# Patient Record
Sex: Male | Born: 1990 | Race: Black or African American | Hispanic: No | Marital: Single | Smoking: Former smoker
Health system: Southern US, Community
[De-identification: ages and names within clinical notes are randomized; demographics above are authoritative.]

## PROBLEM LIST (undated history)

## (undated) DIAGNOSIS — I10 Essential (primary) hypertension: Secondary | ICD-10-CM

## (undated) DIAGNOSIS — K219 Gastro-esophageal reflux disease without esophagitis: Secondary | ICD-10-CM

## (undated) HISTORY — DX: Essential (primary) hypertension: I10

## (undated) HISTORY — PX: OTHER SURGICAL HISTORY: SHX169

## (undated) HISTORY — DX: Gastro-esophageal reflux disease without esophagitis: K21.9

---

## 2018-11-21 ENCOUNTER — Other Ambulatory Visit: Payer: Self-pay

## 2018-11-21 ENCOUNTER — Emergency Department (HOSPITAL_COMMUNITY)
Admission: EM | Admit: 2018-11-21 | Discharge: 2018-11-22 | Disposition: A | Discharge status: Home / Self Care | Payer: Self-pay | Attending: Emergency Medicine | Admitting: Emergency Medicine

## 2018-11-21 DIAGNOSIS — R0789 Other chest pain: Secondary | ICD-10-CM | POA: Insufficient documentation

## 2018-11-21 DIAGNOSIS — I471 Supraventricular tachycardia: Secondary | ICD-10-CM | POA: Insufficient documentation

## 2018-11-21 LAB — COMPREHENSIVE METABOLIC PANEL
ALT: 17 U/L (ref 0–44)
AST: 21 U/L (ref 15–41)
Albumin: 4.3 g/dL (ref 3.5–5.0)
Alkaline Phosphatase: 55 U/L (ref 38–126)
Anion gap: 11 (ref 5–15)
BUN: 9 mg/dL (ref 6–20)
CO2: 28 mmol/L (ref 22–32)
CREATININE: 0.77 mg/dL (ref 0.61–1.24)
Calcium: 9.5 mg/dL (ref 8.9–10.3)
Chloride: 102 mmol/L (ref 98–111)
GFR calc Af Amer: >60 mL/min (ref >60)
GFR calc non Af Amer: >60 mL/min (ref >60)
Glucose, Bld: 103 mg/dL — ABNORMAL HIGH (ref 70–99)
Potassium: 4.7 mmol/L (ref 3.5–5.1)
Sodium: 141 mmol/L (ref 135–145)
Total Bilirubin: 0.6 mg/dL (ref 0.3–1.2)
Total Protein: 7.9 g/dL (ref 6.5–8.1)

## 2018-11-21 LAB — CBC
HCT: 49.8 % (ref 39.0–52.0)
Hemoglobin: 15.3 g/dL (ref 13.0–17.0)
MCH: 26.5 pg (ref 26.0–34.0)
MCHC: 30.7 g/dL (ref 30.0–36.0)
MCV: 86.2 fL (ref 80.0–100.0)
Platelets: 342 10*3/uL (ref 150–400)
RBC: 5.78 MIL/uL (ref 4.22–5.81)
RDW: 13 % (ref 11.5–15.5)
WBC: 10 10*3/uL (ref 4.0–10.5)
nRBC: 0 % (ref 0.0–0.2)

## 2018-11-21 LAB — URINALYSIS, ROUTINE W REFLEX MICROSCOPIC
BACTERIA UA: NONE SEEN
Bilirubin Urine: NEGATIVE
GLUCOSE, UA: NEGATIVE mg/dL
Ketones, ur: NEGATIVE mg/dL
Leukocytes, UA: NEGATIVE
Nitrite: NEGATIVE
Protein, ur: NEGATIVE mg/dL
Specific Gravity, Urine: 1.008 (ref 1.005–1.030)
pH: 6 (ref 5.0–8.0)

## 2018-11-21 LAB — LIPASE, BLOOD: Lipase: 42 U/L (ref 11–51)

## 2018-11-21 LAB — I-STAT TROPONIN, ED: Troponin i, poc: 0 ng/mL (ref 0.00–0.08)

## 2018-11-21 MED ORDER — SODIUM CHLORIDE 0.9% FLUSH: 3.0000 mL | Freq: Once | INTRAVENOUS | Status: DC

## 2018-11-21 NOTE — ED Triage Notes (Signed)
Pt here with chest pain and pressure on and off for the last year.  Had a cath done a year ago when this had all started which showed nothing.  Pt states pain goes from chest into his abdomen and sometimes gets nauseated and throws up. A&Ox4

## 2018-11-22 LAB — TSH: TSH: 2.862 u[IU]/mL (ref 0.350–4.500)

## 2018-11-22 LAB — T4, FREE: Free T4: 0.86 ng/dL (ref 0.82–1.77)

## 2018-11-22 NOTE — ED Notes (Signed)
Pt's HR was noted to be 134 bpm. He stated that he felt like pounding in his chest when his HR became elevated.

## 2018-11-22 NOTE — ED Notes (Signed)
Pt reports that he intermittently experiences a "pounding" sensation in his chest or a fast heart rate when he moves from one position to another. Specifically when he moves from a crouching position and then quickly moves to a standing position. He also feels weak and SOB during these episodes. He denies feeling light headed or dizzy during these episode. No LOC.

## 2018-11-22 NOTE — ED Provider Notes (Signed)
[facility] EMERGENCY DEPARTMENT Provider Note   CSN: [id number] Arrival date & time: 11/21/18  2022     History   Chief Complaint Chief Complaint  Patient presents with  . Chest Pain  . Palpitations    HPI Timothy Barry is a 28 y.o. male.  Presents emergency department chief complaint of racing heart.  Patient states that he has had several episodes of racing in his heart which he has had going on for almost a month.  He states that it will come on at any time.  It would last for several seconds.  He feels short of breath or dizzy.  He denies chest pain.  He has noticed some globus sensation with swallowing.  He denies palpitations.  Patient is new from [place].  Translation services are utilized.  He had a catheterization done in [place] that was -1-year ago.  He denies nausea, vomiting, epigastric abdominal pain, shortness of breath, hemoptysis, cough, fevers, chills.  HPI  No past medical history on file.  There are no active problems to display for this patient.         Home Medications    Prior to Admission medications   Not on File    Family History No family history on file.  Social History Social History   Tobacco Use  . Smoking status: Not on file  Substance Use Topics  . Alcohol use: Not on file  . Drug use: Not on file     Allergies   Patient has no known allergies.   Review of Systems Review of Systems  Ten systems reviewed and are negative for acute change, except as noted in the HPI.   Physical Exam Updated Vital Signs BP 132/85   Pulse 78   Temp 98.4 F (36.9 C) (Oral)   Resp (!) 22   SpO2 100%   Physical Exam Physical Exam  Nursing note and vitals reviewed. Constitutional: He appears well-developed and well-nourished. No distress.  HENT:  Head: Normocephalic and atraumatic.  Eyes: Conjunctivae normal are normal. No scleral icterus.  Neck: Normal range of motion. Neck supple.  Cardiovascular: Normal rate,  regular rhythm and normal heart sounds.   Pulmonary/Chest: Effort normal and breath sounds normal. No respiratory distress.  Abdominal: Soft. There is no tenderness.  Musculoskeletal: He exhibits no edema.  Neurological: He is alert.  Skin: Skin is warm and dry. He is not diaphoretic.  Psychiatric: His behavior is normal.     ED Treatments / Results  Labs (all labs ordered are listed, but only abnormal results are displayed) Labs Reviewed  COMPREHENSIVE METABOLIC PANEL - Abnormal; Notable for the following components:      Result Value   Glucose, Bld 103 (*)    All other components within normal limits  URINALYSIS, ROUTINE W REFLEX MICROSCOPIC - Abnormal; Notable for the following components:   Color, Urine STRAW (*)    Hgb urine dipstick SMALL (*)    All other components within normal limits  LIPASE, BLOOD  CBC  TSH  T4, FREE  T3, FREE  I-STAT TROPONIN, ED    EKG EKG Interpretation  Date/Time:  Friday November 21 2018 23:33:25 EST Ventricular Rate:  79 PR Interval:    QRS Duration: 90 QT Interval:  362 QTC Calculation: 415 R Axis:   65 Text Interpretation:  Sinus rhythm Probable left atrial enlargement ST elevation suggests acute pericarditis No old tracing to compare Confirmed by Drema Pry [phone]) on 11/21/2018 11:38:08 PM   Radiology  No results found.  Procedures Procedures (including critical care time)  Medications Ordered in ED Medications - No data to display   Initial Impression / Assessment and Plan / ED Course  I have reviewed the triage vital signs and the nursing notes.  Pertinent labs & imaging results that were available during my care of the patient were reviewed by me and considered in my medical decision making (see chart for details).    Patient here with episodes of racing heart.  We were able to catch an episode of sinus tachycardia up to 130 on the monitor which lasted for approximately 10 seconds and resolved.  Patient was able to  call out and say that he was having the symptoms at that time.  Although the patient's EKG does show diffuse ST segment elevation he is not complaining of chest pain and I do not think that this is indicative of pericarditis or acute ischemia.  Patient's thyroid labs are negative for any abnormality.  He is otherwise stable without syncope shortness of breath.  Patient is advised to follow with cardiology for paroxysmal sinus tachycardia.  He appears appropriate for discharge at this time  Final Clinical Impressions(s) / ED Diagnoses   Final diagnoses:  Paroxysmal sinus tachycardia [facility])    ED Discharge Orders    None       Arthor Captain, PA-C 11/22/18 7619    Nira Conn, MD 11/22/18 [phone]

## 2018-11-22 NOTE — Discharge Instructions (Signed)
Get help right away if you: Have pain in your chest, upper arms, jaw, or neck. Become weak or dizzy. Feel faint. Have palpitations that do not go away. 

## 2018-11-22 NOTE — ED Notes (Signed)
Patient Alert and oriented to baseline. Stable and ambulatory to baseline. Patient verbalized understanding of the discharge instructions.  Patient belongings were taken by the patient.   

## 2018-11-24 LAB — T3, FREE: T3, Free: 3.5 pg/mL (ref 2.0–4.4)

## 2018-11-27 DIAGNOSIS — R002 Palpitations: Secondary | ICD-10-CM

## 2018-11-27 DIAGNOSIS — R9431 Abnormal electrocardiogram [ECG] [EKG]: Secondary | ICD-10-CM | POA: Insufficient documentation

## 2018-11-27 HISTORY — DX: Palpitations: R00.2

## 2018-11-27 HISTORY — DX: Abnormal electrocardiogram (ECG) (EKG): R94.31

## 2018-11-27 NOTE — Progress Notes (Deleted)
Cardiology Office Note:    Date:  11/27/2018   ID:  Timothy Barry, DOB [birth year], MRN [id number]  PCP:  Patient, No Pcp Per  Cardiologist:  Norman Herrlich, MD   Referring MD: Nira Conn,*  ASSESSMENT:    No diagnosis found. PLAN:    In order of problems listed above:  1. ***  Next appointment   Medication Adjustments/Labs and Tests Ordered: Current medicines are reviewed at length with the patient today.  Concerns regarding medicines are outlined above.  No orders of the defined types were placed in this encounter.  No orders of the defined types were placed in this encounter.    No chief complaint on file. ***  History of Present Illness:    Timothy Barry is a 28 y.o. male who is being seen today for the evaluation of an abnormal EKG done at ED visit 11/21/18 for [palpitation at the request of [name], [name],*.  I independently reviewed the EKG and I think it is most consistent with normal variant early repolarization often seen in young males.   No past medical history on file.  *** The histories are not reviewed yet. Please review them in the "History" navigator section and refresh this SmartLink.  Current Medications: No outpatient medications have been marked as taking for the 11/28/18 encounter (Appointment) with Baldo Daub, MD.     Allergies:   Patient has no known allergies.   Social History   Socioeconomic History  . Marital status: Single    Spouse name: Not on file  . Number of children: Not on file  . Years of education: Not on file  . Highest education level: Not on file  Occupational History  . Not on file  Social Needs  . Financial resource strain: Not on file  . Food insecurity:    Worry: Not on file    Inability: Not on file  . Transportation needs:    Medical: Not on file    Non-medical: Not on file  Tobacco Use  . Smoking status: Not on file  Substance and Sexual Activity  . Alcohol use: Not on file  .  Drug use: Not on file  . Sexual activity: Not on file  Lifestyle  . Physical activity:    Days per week: Not on file    Minutes per session: Not on file  . Stress: Not on file  Relationships  . Social connections:    Talks on phone: Not on file    Gets together: Not on file    Attends religious service: Not on file    Active member of club or organization: Not on file    Attends meetings of clubs or organizations: Not on file    Relationship status: Not on file  Other Topics Concern  . Not on file  Social History Narrative  . Not on file     Family History: The patient's ***family history is not on file.  ROS:   ROS Please see the history of present illness.    *** All other systems reviewed and are negative.  EKGs/Labs/Other Studies Reviewed:    The following studies were reviewed today: ***  EKG:  EKG is *** ordered today.  The ekg ordered today demonstrates ***  Recent Labs: 11/21/2018: ALT 17; BUN 9; Creatinine, Ser 0.77; Hemoglobin 15.3; Platelets 342; Potassium 4.7; Sodium 141 11/22/2018: TSH 2.862  Recent Lipid Panel No results found for: CHOL, TRIG, HDL, CHOLHDL, VLDL, LDLCALC, LDLDIRECT  Physical Exam:  VS:  There were no vitals taken for this visit.    Wt Readings from Last 3 Encounters:  No data found for Wt     GEN: *** Well nourished, well developed in no acute distress HEENT: Normal NECK: No JVD; No carotid bruits LYMPHATICS: No lymphadenopathy CARDIAC: ***RRR, no murmurs, rubs, gallops RESPIRATORY:  Clear to auscultation without rales, wheezing or rhonchi  ABDOMEN: Soft, non-tender, non-distended MUSCULOSKELETAL:  No edema; No deformity  SKIN: Warm and dry NEUROLOGIC:  Alert and oriented x 3 PSYCHIATRIC:  Normal affect     Signed, Norman Herrlich, MD  11/27/2018 10:22 AM    [facility]

## 2018-11-28 ENCOUNTER — Ambulatory Visit: Payer: Self-pay | Admitting: Cardiology

## 2019-11-20 ENCOUNTER — Emergency Department (HOSPITAL_COMMUNITY): Payer: 59

## 2019-11-20 ENCOUNTER — Encounter (HOSPITAL_COMMUNITY): Payer: Self-pay | Admitting: Emergency Medicine

## 2019-11-20 ENCOUNTER — Emergency Department (HOSPITAL_COMMUNITY)
Admission: EM | Admit: 2019-11-20 | Discharge: 2019-11-21 | Disposition: A | Discharge status: Home / Self Care | Payer: 59 | Attending: Emergency Medicine | Admitting: Emergency Medicine

## 2019-11-20 ENCOUNTER — Other Ambulatory Visit: Payer: Self-pay

## 2019-11-20 DIAGNOSIS — R1013 Epigastric pain: Secondary | ICD-10-CM | POA: Insufficient documentation

## 2019-11-20 DIAGNOSIS — R000 Tachycardia, unspecified: Secondary | ICD-10-CM | POA: Insufficient documentation

## 2019-11-20 DIAGNOSIS — M546 Pain in thoracic spine: Secondary | ICD-10-CM | POA: Insufficient documentation

## 2019-11-20 LAB — COMPREHENSIVE METABOLIC PANEL
ALT: 20 U/L (ref 0–44)
AST: 30 U/L (ref 15–41)
Albumin: 4.4 g/dL (ref 3.5–5.0)
Alkaline Phosphatase: 67 U/L (ref 38–126)
Anion gap: 14 (ref 5–15)
BUN: 9 mg/dL (ref 6–20)
CO2: 23 mmol/L (ref 22–32)
Calcium: 9.6 mg/dL (ref 8.9–10.3)
Chloride: 100 mmol/L (ref 98–111)
Creatinine, Ser: 0.75 mg/dL (ref 0.61–1.24)
GFR calc Af Amer: >60 mL/min (ref >60)
GFR calc non Af Amer: >60 mL/min (ref >60)
Glucose, Bld: 123 mg/dL — ABNORMAL HIGH (ref 70–99)
Potassium: 3.2 mmol/L — ABNORMAL LOW (ref 3.5–5.1)
Sodium: 137 mmol/L (ref 135–145)
Total Bilirubin: 0.7 mg/dL (ref 0.3–1.2)
Total Protein: 8.7 g/dL — ABNORMAL HIGH (ref 6.5–8.1)

## 2019-11-20 LAB — CBC WITH DIFFERENTIAL/PLATELET
Abs Immature Granulocytes: 0.02 10*3/uL (ref 0.00–0.07)
Basophils Absolute: 0 10*3/uL (ref 0.0–0.1)
Basophils Relative: 0 %
Eosinophils Absolute: 0.8 10*3/uL — ABNORMAL HIGH (ref 0.0–0.5)
Eosinophils Relative: 7 %
HCT: 48.8 % (ref 39.0–52.0)
Hemoglobin: 15.8 g/dL (ref 13.0–17.0)
Immature Granulocytes: 0 %
Lymphocytes Relative: 45 %
Lymphs Abs: 5.1 10*3/uL — ABNORMAL HIGH (ref 0.7–4.0)
MCH: 27.2 pg (ref 26.0–34.0)
MCHC: 32.4 g/dL (ref 30.0–36.0)
MCV: 84.1 fL (ref 80.0–100.0)
Monocytes Absolute: 1 10*3/uL (ref 0.1–1.0)
Monocytes Relative: 9 %
Neutro Abs: 4.6 10*3/uL (ref 1.7–7.7)
Neutrophils Relative %: 39 %
Platelets: 488 10*3/uL — ABNORMAL HIGH (ref 150–400)
RBC: 5.8 MIL/uL (ref 4.22–5.81)
RDW: 13.1 % (ref 11.5–15.5)
WBC: 11.6 10*3/uL — ABNORMAL HIGH (ref 4.0–10.5)
nRBC: 0 % (ref 0.0–0.2)

## 2019-11-20 LAB — URINALYSIS, ROUTINE W REFLEX MICROSCOPIC
Bilirubin Urine: NEGATIVE
Glucose, UA: NEGATIVE mg/dL
Hgb urine dipstick: NEGATIVE
Ketones, ur: NEGATIVE mg/dL
Leukocytes,Ua: NEGATIVE
Nitrite: NEGATIVE
Protein, ur: NEGATIVE mg/dL
Specific Gravity, Urine: 1.003 — ABNORMAL LOW (ref 1.005–1.030)
pH: 7 (ref 5.0–8.0)

## 2019-11-20 LAB — TROPONIN I (HIGH SENSITIVITY): Troponin I (High Sensitivity): 3 ng/L (ref <18)

## 2019-11-20 MED ORDER — SODIUM CHLORIDE 0.9 % IV BOLUS
2000.0000 mL | Freq: Once | INTRAVENOUS | Status: AC
  Administered 2019-11-20: 22:00:00 2000 mL via INTRAVENOUS

## 2019-11-20 MED ORDER — LORAZEPAM 2 MG/ML IJ SOLN
1.0000 mg | Freq: Once | INTRAMUSCULAR | Status: AC
  Administered 2019-11-20: 23:00:00 1 mg via INTRAVENOUS
  Filled 2019-11-20: qty 1

## 2019-11-20 NOTE — ED Triage Notes (Signed)
After taking pt to the waiting room to pt started having palpitations and his HR jump up to the 161's, EKG done and provider notified.

## 2019-11-20 NOTE — ED Notes (Signed)
Dewayne Hatch daughter [phone] call with update

## 2019-11-20 NOTE — ED Provider Notes (Signed)
Back pain on arrrival Found tachycardic to 160's  120's - 130's now Having mid-thoracic back pain and epigastric pain. No chest pain Labs pending, including thyroid No fever, sxs of infection - no cough, vomiting, SOB CTA pending EKG sinus tachycardia Getting fluids, improved heart rate  Consider metoprolol if he remains tachy on discharge  Plan: anticipate discharge home Review CTA, re-eval heart rate after fluids and Ativan  12:50 - patient comfortable. HR 89. CTA pending.  CT abd and CTA are negative for acute findings. Patient updated. His heart rate remains below 100 on recheck. Will refer to cardiology. Patient acknowledges understanding of plan to follow up in the outpatient setting.     Elpidio Anis, PA-C 11/21/19 0618    Ward, Layla Maw, DO 11/21/19 [phone]

## 2019-11-20 NOTE — ED Triage Notes (Signed)
Pt brought to ED by EMS for mid back pain since yesterday 6pm denies any injury no urinary symptoms.

## 2019-11-20 NOTE — ED Provider Notes (Signed)
[facility] EMERGENCY DEPARTMENT Provider Note   CSN: [id number] Arrival date & time: 11/20/19  2101     History Chief Complaint  Patient presents with  . Back Pain  . Palpitations    Timothy Barry is a 29 y.o. male.  Timothy Barry is a 29 y.o. male history of palpitations, who presents to the ED for evaluation of back pain and palpitations.  Patient initially checked in reporting midthoracic back pain that started yesterday, he describes pain as sharp and intermittent with some associated epigastric pain.  While here waiting in the department he began experiencing heart racing sensation was found to have a heart rate in the 160s.  He denies associated chest pain or shortness of breath.  He denies lightheadedness or syncope.  He states that he has had similar issues with fast heart rate before and at 1 point was on a medication to control his heart rate, and followed with a heart doctor not here in [place].  He states pain in the thoracic back is worse with movement.  It is not radiating into the chest, he denies tearing no ripping sensation.  He has not had any nausea or vomiting, no diarrhea, has not recently eaten anything out of the ordinary.  He denies fevers or chills, no cough, no body aches, no sore throat.  No dysuria.  He has not taken anything to treat his symptoms prior to arrival.  No other aggravating or alleviating factors.  The history is provided by the patient. The history is limited by a language barrier. A language interpreter was used.       History reviewed. No pertinent past medical history.  Patient Active Problem List   Diagnosis Date Noted  . Abnormal EKG 11/27/2018  . Palpitation 11/27/2018    History reviewed. No pertinent surgical history.     History reviewed. No pertinent family history.  Social History   Tobacco Use  . Smoking status: Never Smoker  . Smokeless tobacco: Never Used  Substance Use Topics  . Alcohol use:  Never  . Drug use: Never    Home Medications Prior to Admission medications   Not on File    Allergies    Patient has no known allergies.  Review of Systems   Review of Systems  Constitutional: Negative for chills and fever.  HENT: Negative.   Respiratory: Negative for cough and shortness of breath.   Cardiovascular: Positive for palpitations. Negative for chest pain and leg swelling.  Gastrointestinal: Positive for abdominal pain. Negative for diarrhea, nausea and vomiting.  Genitourinary: Negative for dysuria and frequency.  Musculoskeletal: Positive for back pain.  Skin: Negative for color change and rash.  Neurological: Negative for dizziness, syncope, weakness, light-headedness, numbness and headaches.    Physical Exam Updated Vital Signs BP (!) 151/88 (BP Location: Left Arm)   Pulse 84   Temp 99.1 F (37.3 C) (Oral)   Resp 16   Ht 5\' 10"  (1.778 m)   Wt 136.1 kg   SpO2 98%   BMI 43.05 kg/m   Physical Exam Vitals and nursing note reviewed.  Constitutional:      General: He is not in acute distress.    Appearance: He is well-developed. He is not diaphoretic.  HENT:     Head: Normocephalic and atraumatic.  Eyes:     General:        Right eye: No discharge.        Left eye: No discharge.  Pupils: Pupils are equal, round, and reactive to light.  Cardiovascular:     Rate and Rhythm: Regular rhythm. Tachycardia present.     Heart sounds: Normal heart sounds.     Comments: Tachycardia with heart rate ranging between 140-160, regular rhythm Pulmonary:     Effort: Pulmonary effort is normal. No respiratory distress.     Breath sounds: Normal breath sounds. No wheezing or rales.  Abdominal:     General: Bowel sounds are normal. There is no distension.     Palpations: Abdomen is soft. There is no mass.     Tenderness: There is abdominal tenderness. There is no guarding.     Comments: Abdomen soft, nondistended, bowel sounds present throughout, mild epigastric  tenderness without guarding or rebound, all other quadrants nontender  Musculoskeletal:        General: No deformity.     Cervical back: Neck supple.  Skin:    General: Skin is warm and dry.     Capillary Refill: Capillary refill takes less than 2 seconds.  Neurological:     Mental Status: He is alert.     Coordination: Coordination normal.     Comments: Speech is clear, able to follow commands 5/5 strength in bilateral upper and lower extremities Moves extremities without ataxia, coordination intact  Psychiatric:        Mood and Affect: Mood normal.        Behavior: Behavior normal.     ED Results / Procedures / Treatments   Labs (all labs ordered are listed, but only abnormal results are displayed) Labs Reviewed  URINALYSIS, ROUTINE W REFLEX MICROSCOPIC - Abnormal; Notable for the following components:      Result Value   Color, Urine STRAW (*)    Specific Gravity, Urine 1.003 (*)    All other components within normal limits  CBC WITH DIFFERENTIAL/PLATELET - Abnormal; Notable for the following components:   WBC 11.6 (*)    Platelets 488 (*)    Lymphs Abs 5.1 (*)    Eosinophils Absolute 0.8 (*)    All other components within normal limits  COMPREHENSIVE METABOLIC PANEL  TROPONIN I (HIGH SENSITIVITY)    EKG EKG Interpretation  Date/Time:  Friday November 20 2019 21:18:03 EST Ventricular Rate:  162 PR Interval:  100 QRS Duration: 84 QT Interval:  254 QTC Calculation: 416 R Axis:   134 Text Interpretation: Sinus tachycardia with short PR Right axis deviation Abnormal ECG rate significantly faster than Nov 22 2018 Confirmed by Pricilla Loveless [phone]) on 11/20/2019 9:36:11 PM   Radiology DG Chest Port 1 View  Result Date: 11/20/2019 CLINICAL DATA:  Chest pain EXAM: PORTABLE CHEST 1 VIEW COMPARISON:  None. FINDINGS: The heart size and mediastinal contours are within normal limits. Both lungs are clear. The visualized skeletal structures are unremarkable. IMPRESSION: No  active disease. Electronically Signed   By: Jasmine Pang M.D.   On: 11/20/2019 21:52    Procedures Procedures (including critical care time)  Medications Ordered in ED Medications  sodium chloride 0.9 % bolus 2,000 mL (2,000 mLs Intravenous New Bag/Given 11/20/19 2226)  LORazepam (ATIVAN) injection 1 mg (1 mg Intravenous Given 11/20/19 2236)  iohexol (OMNIPAQUE) 350 MG/ML injection 100 mL (100 mLs Intravenous Contrast Given 11/21/19 0006)    ED Course  I have reviewed the triage vital signs and the nursing notes.  Pertinent labs & imaging results that were available during my care of the patient were reviewed by me and considered in my medical decision  making (see chart for details).  Clinical Course as of Nov 20 33  Fri Nov 20, 2019  2951 29 year old male presented initially with thoracic back pain, but then reported palpitations and was found to have a heart rate in the 160s.  EKG is consistent with sinus tachycardia.  Patient states intermittent palpitations since yesterday associated with thoracic back pain and epigastric abdominal pain.  No associated chest pains.  Will give IV fluids and Ativan, will get lab evaluation including troponins, abdominal labs, TSH, will check CTA of the chest and CT abdomen pelvis.   [KF]  2200 Mild leukocytosis, potassium of 3.2 but no other significant electrolyte derangements, UA without signs of infection, additional lab work pending   [KF]  2200 Chest x-ray is clear  DG Chest Port 1 View [KF]  2349 At shift change care signed out to PA [facility] pending additional lab evaluation and CTAs of the chest abdomen and pelvis.  Patient is being treated with IV fluids and Ativan for his sinus tachycardia.  If evaluation is   [KF]    Clinical Course User Index [KF] Legrand Rams   MDM Rules/Calculators/A&P                      Patient initially presented with thoracic back pain, later developed palpitations and was found to have a heart  rate in the 160s.  Throughout evaluation patient remained in sinus tachycardia, no evidence of SVT.  He complained of some midthoracic back pain as well as some epigastric pain no pain radiating into the chest, no associated shortness of breath.  Despite tachycardia patient is well-appearing and vitals are otherwise stable.  He does appear somewhat anxious.  He was given 2 L IV fluid and Ativan.  Lab work as well as CT imaging of the chest abdomen and pelvis ordered.  Care signed out to PA [facility] pending lab and imaging evaluation.  If work-up is reassuring and heart rate returned to normal patient can be discharged home.  If heart rate remains elevated would consider beta-blocker dose.  Final Clinical Impression(s) / ED Diagnoses Final diagnoses:  Sinus tachycardia  Epigastric pain  Acute midline thoracic back pain    Rx / DC Orders ED Discharge Orders    None       Legrand Rams 11/21/19 2333    Pricilla Loveless, MD 11/23/19 [phone]

## 2019-11-21 LAB — TROPONIN I (HIGH SENSITIVITY): Troponin I (High Sensitivity): 10 ng/L (ref <18)

## 2019-11-21 LAB — LIPASE, BLOOD: Lipase: 28 U/L (ref 11–51)

## 2019-11-21 LAB — TSH: TSH: 0.603 u[IU]/mL (ref 0.350–4.500)

## 2019-11-21 MED ORDER — IOHEXOL 350 MG/ML SOLN
100.0000 mL | Freq: Once | INTRAVENOUS | Status: AC | PRN
  Administered 2019-11-21: 100 mL via INTRAVENOUS

## 2019-11-21 NOTE — Discharge Instructions (Addendum)
Your lab and x-ray tests are all reassuring. Your thyroid test did result tonight and is normal also.   Please call [facility] at the number provided to schedule an outpatient appointment for further evaluation of high heart rate.

## 2019-11-21 NOTE — ED Notes (Signed)
Ativan 1mg  wasted in jug  Witnessed by yasemia rn

## 2019-11-24 ENCOUNTER — Telehealth: Payer: Self-pay | Admitting: Radiology

## 2019-11-24 ENCOUNTER — Encounter: Payer: Self-pay | Admitting: Cardiology

## 2019-11-24 ENCOUNTER — Other Ambulatory Visit: Payer: Self-pay

## 2019-11-24 ENCOUNTER — Ambulatory Visit (INDEPENDENT_AMBULATORY_CARE_PROVIDER_SITE_OTHER): Payer: 59 | Admitting: Cardiology

## 2019-11-24 VITALS — BP 134/78 | HR 84 | Temp 97.3°F | Ht 70.0 in | Wt 286.0 lb

## 2019-11-24 DIAGNOSIS — R002 Palpitations: Secondary | ICD-10-CM

## 2019-11-24 DIAGNOSIS — R072 Precordial pain: Secondary | ICD-10-CM | POA: Diagnosis not present

## 2019-11-24 NOTE — Telephone Encounter (Signed)
Enrolled patient for a 30 day [employer] Event monitor to be mailed to patients home.  

## 2019-11-24 NOTE — Patient Instructions (Signed)
Medication Instructions:  NO CHANGE *If you need a refill on your cardiac medications before your next appointment, please call your pharmacy*  Lab Work: If you have labs (blood work) drawn today and your tests are completely normal, you will receive your results only by: Marland Kitchen MyChart Message (if you have MyChart) OR . A paper copy in the mail If you have any lab test that is abnormal or we need to change your treatment, we will call you to review the results.  Testing/Procedures: Your physician has requested that you have an echocardiogram. Echocardiography is a painless test that uses sound waves to create images of your heart. It provides your doctor with information about the size and shape of your heart and how well your heart's chambers and valves are working. This procedure takes approximately one hour. There are no restrictions for this procedure.[address]  Your physician has recommended that you wear a 30 DAY event monitor. Event monitors are medical devices that record the heart's electrical activity. Doctors most often [place] these monitors to diagnose arrhythmias. Arrhythmias are problems with the speed or rhythm of the heartbeat. The monitor is a small, portable device. You can wear one while you do your normal daily activities. This is usually used to diagnose what is causing palpitations/syncope (passing out).MAILED TO YOUR HOME      Follow-Up: At [facility], you and your health needs are our priority.  As part of our continuing mission to provide you with exceptional heart care, we have created designated Provider Care Teams.  These Care Teams include your primary Cardiologist (physician) and Advanced Practice Providers (APPs -  Physician Assistants and Nurse Practitioners) who all work together to provide you with the care you need, when you need it.  Your next appointment:   6 week(s)  The format for your next appointment:   In Person  Provider:   You may see  one of the following Advanced Practice Providers on your designated Care Team:    Corine Shelter, PA-C  [place], [place]  Edd Fabian, [place]

## 2019-11-24 NOTE — Progress Notes (Signed)
Referring-Kelsey Ala Dach, PA-C Reason for referral-palpitations  HPI: 29 year old male for evaluation of palpitations at request of Jodi Geralds, PA-C.  Recently seen in the emergency room with complaints of back pain and palpitations.  Initial heart rate 160 by report.  CTA January 2021 showed no pulmonary embolus or dissection.  Troponins normal.  TSH normal, potassium 3.2, hemoglobin 15.8.  Patient was hydrated and referred to cardiology for further evaluation.  Patient is from [place].  He apparently had testing on his heart 2 years ago for the above and etiology was not found.  He states he has intermittent palpitations described as heart racing.  Last typically 10 minutes and resolves.  More common after eating.  He denies dyspnea on exertion, orthopnea or pedal edema.  He has had continuous chest pain for 1 year by his report.  He  No current outpatient medications on file.   No current facility-administered medications for this visit.    No Known Allergies   Past Medical History:  Diagnosis Date  . Abnormal EKG 11/27/2018  . Palpitation 11/27/2018    Past Surgical History:  Procedure Laterality Date  . No prior surgery      Social History   Socioeconomic History  . Marital status: Single    Spouse name: Not on file  . Number of children: Not on file  . Years of education: Not on file  . Highest education level: Not on file  Occupational History  . Not on file  Tobacco Use  . Smoking status: Former Games developer  . Smokeless tobacco: Never Used  Substance and Sexual Activity  . Alcohol use: Never  . Drug use: Never  . Sexual activity: Not on file  Other Topics Concern  . Not on file  Social History Narrative  . Not on file   Social Determinants of Health   Financial Resource Strain:   . Difficulty of Paying Living Expenses: Not on file  Food Insecurity:   . Worried About Programme researcher, broadcasting/film/video in the Last Year: Not on file  . Ran Out of Food in the Last Year: Not on  file  Transportation Needs:   . Lack of Transportation (Medical): Not on file  . Lack of Transportation (Non-Medical): Not on file  Physical Activity:   . Days of Exercise per Week: Not on file  . Minutes of Exercise per Session: Not on file  Stress:   . Feeling of Stress : Not on file  Social Connections:   . Frequency of Communication with Friends and Family: Not on file  . Frequency of Social Gatherings with Friends and Family: Not on file  . Attends Religious Services: Not on file  . Active Member of Clubs or Organizations: Not on file  . Attends Banker Meetings: Not on file  . Marital Status: Not on file  Intimate Partner Violence:   . Fear of Current or Ex-Partner: Not on file  . Emotionally Abused: Not on file  . Physically Abused: Not on file  . Sexually Abused: Not on file    Family History  Problem Relation Age of Onset  . Cancer Father     ROS: no fevers or chills, productive cough, hemoptysis, dysphasia, odynophagia, melena, hematochezia, dysuria, hematuria, rash, seizure activity, orthopnea, PND, pedal edema, claudication. Remaining systems are negative.  Physical Exam:   Blood pressure 134/78, pulse 84, temperature (!) 97.3 F (36.3 C), height 5\' 10"  (1.778 m), weight 286 lb (129.7 kg), SpO2 99 %.  General:  Well developed/well nourished in NAD Skin warm/dry Patient not depressed No peripheral clubbing Back-normal HEENT-normal/normal eyelids Neck supple/normal carotid upstroke bilaterally; no bruits; no JVD; no thyromegaly chest - CTA/ normal expansion CV - RRR/normal S1 and S2; no murmurs, rubs or gallops;  PMI nondisplaced Abdomen -NT/ND, no HSM, no mass, + bowel sounds, no bruit 2+ femoral pulses, no bruits Ext-no edema, chords, 2+ DP Neuro-grossly nonfocal  ECG -November 20, 2019-sinus tachycardia, right axis deviation.  Follow-up electrocardiogram November 22, 2019 showed sinus rhythm with no ST changes.  Personally reviewed  A/P  1  palpitations-etiology unclear.  We will arrange an event monitor to further assess.  Recent TSH normal.  2 chest pain-symptoms atypical.  They have been continuous for 1 year.  Recent CTA showed no pulmonary embolus.  We will arrange echocardiogram to assess LV function.  Kirk Ruths, MD

## 2019-11-25 ENCOUNTER — Encounter (HOSPITAL_COMMUNITY): Payer: Self-pay | Admitting: Emergency Medicine

## 2019-11-25 ENCOUNTER — Emergency Department (HOSPITAL_COMMUNITY)
Admission: EM | Admit: 2019-11-25 | Discharge: 2019-11-25 | Disposition: A | Discharge status: Home / Self Care | Payer: 59 | Attending: Emergency Medicine | Admitting: Emergency Medicine

## 2019-11-25 ENCOUNTER — Emergency Department (HOSPITAL_COMMUNITY): Payer: 59

## 2019-11-25 ENCOUNTER — Other Ambulatory Visit: Payer: Self-pay

## 2019-11-25 DIAGNOSIS — K219 Gastro-esophageal reflux disease without esophagitis: Secondary | ICD-10-CM | POA: Diagnosis not present

## 2019-11-25 DIAGNOSIS — Z87891 Personal history of nicotine dependence: Secondary | ICD-10-CM | POA: Insufficient documentation

## 2019-11-25 DIAGNOSIS — R1013 Epigastric pain: Secondary | ICD-10-CM | POA: Diagnosis present

## 2019-11-25 DIAGNOSIS — Z7982 Long term (current) use of aspirin: Secondary | ICD-10-CM | POA: Diagnosis not present

## 2019-11-25 DIAGNOSIS — R0789 Other chest pain: Secondary | ICD-10-CM | POA: Insufficient documentation

## 2019-11-25 DIAGNOSIS — R1011 Right upper quadrant pain: Secondary | ICD-10-CM

## 2019-11-25 LAB — URINALYSIS, ROUTINE W REFLEX MICROSCOPIC
Bacteria, UA: NONE SEEN
Bilirubin Urine: NEGATIVE
Glucose, UA: NEGATIVE mg/dL
Ketones, ur: NEGATIVE mg/dL
Leukocytes,Ua: NEGATIVE
Nitrite: NEGATIVE
Protein, ur: NEGATIVE mg/dL
Specific Gravity, Urine: 1.004 — ABNORMAL LOW (ref 1.005–1.030)
pH: 7 (ref 5.0–8.0)

## 2019-11-25 LAB — COMPREHENSIVE METABOLIC PANEL
ALT: 19 U/L (ref 0–44)
AST: 22 U/L (ref 15–41)
Albumin: 4.2 g/dL (ref 3.5–5.0)
Alkaline Phosphatase: 62 U/L (ref 38–126)
Anion gap: 8 (ref 5–15)
BUN: 9 mg/dL (ref 6–20)
CO2: 28 mmol/L (ref 22–32)
Calcium: 9.6 mg/dL (ref 8.9–10.3)
Chloride: 102 mmol/L (ref 98–111)
Creatinine, Ser: 0.65 mg/dL (ref 0.61–1.24)
GFR calc Af Amer: >60 mL/min (ref >60)
GFR calc non Af Amer: >60 mL/min (ref >60)
Glucose, Bld: 106 mg/dL — ABNORMAL HIGH (ref 70–99)
Potassium: 3.8 mmol/L (ref 3.5–5.1)
Sodium: 138 mmol/L (ref 135–145)
Total Bilirubin: 0.6 mg/dL (ref 0.3–1.2)
Total Protein: 8.4 g/dL — ABNORMAL HIGH (ref 6.5–8.1)

## 2019-11-25 LAB — CBC WITH DIFFERENTIAL/PLATELET
Abs Immature Granulocytes: 0.02 10*3/uL (ref 0.00–0.07)
Basophils Absolute: 0 10*3/uL (ref 0.0–0.1)
Basophils Relative: 1 %
Eosinophils Absolute: 0.5 10*3/uL (ref 0.0–0.5)
Eosinophils Relative: 9 %
HCT: 47.9 % (ref 39.0–52.0)
Hemoglobin: 15.5 g/dL (ref 13.0–17.0)
Immature Granulocytes: 0 %
Lymphocytes Relative: 39 %
Lymphs Abs: 2.5 10*3/uL (ref 0.7–4.0)
MCH: 27.4 pg (ref 26.0–34.0)
MCHC: 32.4 g/dL (ref 30.0–36.0)
MCV: 84.8 fL (ref 80.0–100.0)
Monocytes Absolute: 0.6 10*3/uL (ref 0.1–1.0)
Monocytes Relative: 10 %
Neutro Abs: 2.7 10*3/uL (ref 1.7–7.7)
Neutrophils Relative %: 41 %
Platelets: 382 10*3/uL (ref 150–400)
RBC: 5.65 MIL/uL (ref 4.22–5.81)
RDW: 13 % (ref 11.5–15.5)
WBC: 6.4 10*3/uL (ref 4.0–10.5)
nRBC: 0 % (ref 0.0–0.2)

## 2019-11-25 LAB — TROPONIN I (HIGH SENSITIVITY): Troponin I (High Sensitivity): 3 ng/L (ref <18)

## 2019-11-25 LAB — LIPASE, BLOOD: Lipase: 28 U/L (ref 11–51)

## 2019-11-25 MED ORDER — LIDOCAINE VISCOUS HCL 2 % MT SOLN
15.0000 mL | Freq: Once | OROMUCOSAL | Status: AC
  Administered 2019-11-25: 15 mL via ORAL
  Filled 2019-11-25: qty 15

## 2019-11-25 MED ORDER — PANTOPRAZOLE SODIUM 20 MG PO TBEC: 20.0000 mg | DELAYED_RELEASE_TABLET | Freq: Every day | ORAL | 0 refills | Status: DC

## 2019-11-25 MED ORDER — ALUM & MAG HYDROXIDE-SIMETH 200-200-20 MG/5ML PO SUSP
30.0000 mL | Freq: Once | ORAL | Status: AC
  Administered 2019-11-25: 30 mL via ORAL
  Filled 2019-11-25: qty 30

## 2019-11-25 NOTE — Discharge Instructions (Signed)
You have been diagnosed today with epigastric pain, GERD.  At this time there does not appear to be the presence of an emergent medical condition, however there is always the potential for conditions to change. Please read and follow the below instructions.  Please return to the Emergency Department immediately for any new or worsening symptoms. Please be sure to follow up with your Primary Care Provider within one week regarding your visit today; please call their office to schedule an appointment even if you are feeling better for a follow-up visit.  Please call the [facility] community health and wellness phone number on your discharge paperwork to establish a primary care provider if you do not already have one. Please take the medication Protonix as prescribed to help with your symptoms.  Please drink plenty of water and get plenty of rest.  Get help right away if you: Have pain in your arms, neck, jaw, teeth, or back. Feel sweaty, dizzy, or light-headed. Have chest pain or shortness of breath. Vomit and your vomit looks like blood or coffee grounds. Faint. Have stool that is bloody or black. Cannot swallow, drink, or eat. Your pain does not go away as soon as your doctor says it should. You cannot stop vomiting. Your pain is only in areas of your belly, such as the right side or the left lower part of the belly. You have bloody or black poop, or poop that looks like tar. You have very bad pain, cramping, or bloating in your belly. You have signs of not having enough fluid or water in your body (dehydration), such as: Dark pee, very little pee, or no pee. Cracked lips. Dry mouth. Sunken eyes. Sleepiness. Weakness. You have trouble breathing or chest pain. You have any new/concerning or worsening of symptoms  Please read the additional information packets attached to your discharge summary.  Do not take your medicine if  develop an itchy rash, swelling in your mouth or lips, or  difficulty breathing; call 911 and seek immediate emergency medical attention if this occurs.  Note: Portions of this text may have been transcribed using voice recognition software. Every effort was made to ensure accuracy; however, inadvertent computerized transcription errors may still be present. -------------- Below has been translated using Google translate.  Errors may be present.  Interpret with caution. ????? ??? ?????? ???????? ????? ????. ?? ???? ??????? ??????. ??? ????. -------------- ??? ?? ?????? ????? ???? ?????? ? ?????? ??????.  ?? ??? ????? ?? ???? ?? ???? ???? ???? ????? ? ???? ???? ?????? ??????? ?????? ??????. ???? ????? ?????? ????????? ?????.  1. ???? ?????? ??? ??? ??????? ??? ????? ??? ????? ????? ?? ?????. 2. ???? ?????? ?? ???????? ?? ???? ??????? ??????? ????? ?? ?? ???? ????? ???? ????? ?????? ????? ? ???? ??????? ??????? ?????? ???? ??? ??? ??? ???? ????? ?? ????? ??????. ???? ??????? ???? ???? ????? ???????? ?????? ?????? [place] ?? ????? ?????? ?????? ?? ?????? ???? ????? ????? ??? ?? ??? ???? ???? ??????. 3. ???? ????? ???? Protonix ??? ?? ????? ???????? ?? ???? ???????. ???? ??? ?????? ?? ????? ??????? ??? ??? ???? ?? ??????.  ???? ??? ???????? ????? ??? ???: ? ???? ??? ?? ?????? ?? ????? ?? ??? ?? ?????? ?? ????. ? ???? ??????? ?? ?????? ?? ??????. ? ???? ??? ?? ????? ?? ??? ?? ??????. ? ???? ????? ?????? ???? ?? ??????. ? ???? ????. ? ???? ???? ???? ?? ????. ? ?? ?????? ????? ?? ????? ?? ?????. 1. ???? ?? ???? ????? ?? ???? ????? ???.  2. ?? ????? ?????? ?? ??????. 3. ???? ????? ?? ????? ???? ??? ? ??? ?????? ?????? ?? ????? ?????? ?????? ?? ?????. 4. ???? ????? ???? ?? ???? ? ?? ???? ???? ???????. 5. ???? ??? ???? ?? ???? ?? ?????? ?? ????. 6. ???? ?????? ??? ??? ??? ???? ?? ???? ?? ??????? ?? ????? ?? ???? (??????) ? ???: o ??? ???? ? ??? ???? ??? ? ?? ???? ???. o ???? ??????. o ???? ????. ?? ???? ?????. ? ??????. o ???. 7. ???? ????? ??  ?????? ?? ??? ?? ?????. 8. ???? ?? ????? ????? / ????? ?? ????? ???????  ???? ????? ??? ????????? ???????? ??????? ????? ?????? ????? ??.  ?? ?????? ????? ?? ???? ???? ??? ???? ???? ????? ?? ???? ?? ??? ?? ????? ?? ????? ?? ?????? ? ???? ???? 911 ????? ????? ???? ????? ????? ??? ??? ???.  ??????: ?? ??? ??? ????? ?? ??? ???? ???????? ?????? ?????? ??? ?????. ?? ??? ?? ??? ????? ????? ? ??? ??? ? ?? ?? ???? ????? ????? ???????? ??? ???????? ??????.

## 2019-11-25 NOTE — ED Triage Notes (Signed)
Pt c/o epigastric/central lower chest pains that is intermittent x 4 days. Reports is worse after eating or drinking. Has nausea but no vomiting.

## 2019-11-25 NOTE — ED Provider Notes (Signed)
[facility]-EMERGENCY DEPT Provider Note   CSN: [id number] Arrival date & time: 11/25/19  1645     History Chief Complaint  Patient presents with  . Abdominal Pain  . Chest Pain    Timothy Barry is a 29 y.o. male presents today for epigastric pain x4 days.  Patient reports a constant, waxing and waning burning sensation that radiates from epigastric area up into his lower chest.  Patient reports pain is worsened with eating and drinking and improved if he does not swallow anything for a while.  He reports mild nausea without vomiting as only associated symptom.  Denies fever/chills, fall/injury, headache, neck pain, shortness of breath, cough/hemoptysis, history of blood clot, numbness/tingling, weakness, diaphoresis, vomiting/diarrhea, extremity swelling/color change, recent surgeries or immobilizations, hormone use, history of cancer or additional concerns.  HPI     Past Medical History:  Diagnosis Date  . Abnormal EKG 11/27/2018  . Palpitation 11/27/2018    Patient Active Problem List   Diagnosis Date Noted  . Abnormal EKG 11/27/2018  . Palpitation 11/27/2018    Past Surgical History:  Procedure Laterality Date  . No prior surgery         Family History  Problem Relation Age of Onset  . Cancer Father     Social History   Tobacco Use  . Smoking status: Former Games developer  . Smokeless tobacco: Never Used  Substance Use Topics  . Alcohol use: Never  . Drug use: Never    Home Medications Prior to Admission medications   Medication Sig Start Date End Date Taking? Authorizing Provider  aspirin EC 81 MG tablet Take 81 mg by mouth daily.   Yes [provider]  pantoprazole (PROTONIX) 20 MG tablet Take 1 tablet (20 mg total) by mouth daily. 11/25/19 12/25/19  Bill Salinas, PA-C    Allergies    Patient has no known allergies.  Review of Systems   Review of Systems Ten systems are reviewed and are negative for acute change except  as noted in the HPI  Physical Exam Updated Vital Signs BP 132/77   Pulse 70   Temp 98.3 F (36.8 C) (Oral)   Resp 19   SpO2 100%   Physical Exam Constitutional:      General: He is not in acute distress.    Appearance: Normal appearance. He is well-developed. He is not ill-appearing or diaphoretic.  HENT:     Head: Normocephalic and atraumatic.     Right Ear: External ear normal.     Left Ear: External ear normal.     Nose: Nose normal.  Eyes:     General: Vision grossly intact. Gaze aligned appropriately.     Pupils: Pupils are equal, round, and reactive to light.  Neck:     Trachea: Trachea and phonation normal. No tracheal deviation.  Cardiovascular:     Rate and Rhythm: Normal rate and regular rhythm.     Pulses:          Radial pulses are 2+ on the right side and 2+ on the left side.       Dorsalis pedis pulses are 2+ on the right side and 2+ on the left side.     Heart sounds: Normal heart sounds.  Pulmonary:     Effort: Pulmonary effort is normal. No respiratory distress.     Breath sounds: Normal breath sounds.  Chest:     Chest wall: No deformity.  Abdominal:     General: There is  no distension.     Palpations: Abdomen is soft.     Tenderness: There is abdominal tenderness (Mild) in the epigastric area. There is no guarding or rebound.  Musculoskeletal:        General: Normal range of motion.     Cervical back: Normal range of motion.     Right lower leg: No tenderness. No edema.     Left lower leg: No tenderness. No edema.  Skin:    General: Skin is warm and dry.  Neurological:     Mental Status: He is alert.     GCS: GCS eye subscore is 4. GCS verbal subscore is 5. GCS motor subscore is 6.     Comments: Speech is clear and goal oriented, follows commands Major Cranial nerves without deficit, no facial droop Moves extremities without ataxia, coordination intact  Psychiatric:        Behavior: Behavior normal.     ED Results / Procedures / Treatments    Labs (all labs ordered are listed, but only abnormal results are displayed) Labs Reviewed  URINALYSIS, ROUTINE W REFLEX MICROSCOPIC - Abnormal; Notable for the following components:      Result Value   Color, Urine STRAW (*)    Specific Gravity, Urine 1.004 (*)    Hgb urine dipstick SMALL (*)    All other components within normal limits  COMPREHENSIVE METABOLIC PANEL - Abnormal; Notable for the following components:   Glucose, Bld 106 (*)    Total Protein 8.4 (*)    All other components within normal limits  LIPASE, BLOOD  CBC WITH DIFFERENTIAL/PLATELET  TROPONIN I (HIGH SENSITIVITY)    EKG EKG Interpretation  Date/Time:  Wednesday November 25 2019 16:55:21 EST Ventricular Rate:  84 PR Interval:    QRS Duration: 86 QT Interval:  340 QTC Calculation: 402 R Axis:     Text Interpretation: Sinus rhythm ST elev, probable normal early repol pattern Confirmed by Davonna Belling [id number]) on 11/25/2019 8:35:22 PM   Radiology DG Chest 2 View  Result Date: 11/25/2019 CLINICAL DATA:  Chest pain. EXAM: CHEST - 2 VIEW COMPARISON:  November 20, 2019 FINDINGS: The heart size and mediastinal contours are within normal limits. Both lungs are clear. The visualized skeletal structures are unremarkable. IMPRESSION: No active cardiopulmonary disease. Electronically Signed   By: Virgina Norfolk M.D.   On: 11/25/2019 17:26   US Abdomen Limited RUQ  Result Date: 11/25/2019 CLINICAL DATA:  Right upper quadrant abdominal pain. EXAM: ULTRASOUND ABDOMEN LIMITED RIGHT UPPER QUADRANT COMPARISON:  11/21/2019 FINDINGS: Gallbladder: No gallstones or wall thickening visualized. No sonographic Murphy sign noted by sonographer. Common bile duct: Diameter: 3 mm Liver: No focal lesion identified. Within normal limits in parenchymal echogenicity. Portal vein is patent on color Doppler imaging with normal direction of blood flow towards the liver. Other: None. IMPRESSION: Normal study.  No findings to explain the  patient's abdominal pain. Electronically Signed   By: Constance Holster M.D.   On: 11/25/2019 19:27    Procedures Procedures (including critical care time)  Medications Ordered in ED Medications  alum & mag hydroxide-simeth (MAALOX/MYLANTA) 200-200-20 MG/5ML suspension 30 mL (30 mLs Oral Given 11/25/19 2015)    And  lidocaine (XYLOCAINE) 2 % viscous mouth solution 15 mL (15 mLs Oral Given 11/25/19 2015)    ED Course  I have reviewed the triage vital signs and the nursing notes.  Pertinent labs & imaging results that were available during my care of the patient were reviewed by me  and considered in my medical decision making (see chart for details).    MDM Rules/Calculators/A&P                     Chart review shows recent ED visit on 11/20/2019 for sinus tachycardia, epigastric pain, and midline lower back pain.  Work-up included:  Urinalysis nonacute CMP nonacute, mild elevated glucose and protein CBC with leukocytosis of 11.6 High-sensitivity troponin: 3 CXR: IMPRESSION: No active disease. Lipase within normal limits TSH within normal limits Delta high-sensitivity troponin: 10 CT angio chest PE study: IMPRESSION: No active disease.  CT KT:[id number]: No acute abnormality noted.  ============ Patient was then seen yesterday by cardiology, Dr. Jens Som on 11/25/2019  According to assessment and plan palpitations of unclear etiology and is arranging for patient to have an event monitor, chest pain thought to be atypical as continuous for 1 year per note, they are arranging an echocardiogram to assess left ventricular function. ================ Patient arrived today with epigastric pain worse with eating, mildly tender to palpation, question possible gallbladder etiology will obtain lab work and ultrasound.  High-sensitivity troponin within normal limits Lipase within normal limits CBC within normal limits CMP nonacute, mildly elevated glucose and protein Chest x-ray:    IMPRESSION:  No active cardiopulmonary disease.   RUQ [place]:  IMPRESSION:  Normal study. No findings to explain the patient's abdominal pain.   EKG: Sinus rhythm ST elev, probable normal early repol pattern Confirmed by Benjiman Core [phone]) on 11/25/2019 8:35:22 PM  Patient was given GI cocktail, reassessed resting comfortably no acute distress using his phone.  Suspect GERD as etiology of patient's symptoms today.  He had extensive work-up last week which was largely reassuring, he is low risk by Wells criteria and PERC negative and with negative PE study last week doubt pulmonary embolism as etiology of symptoms today.  With ongoing pain for 4 days and a troponin within normal limits there is no indication for delta troponin at this time, doubt ACS as etiology of his symptoms.  Additionally he had CT of the chest abdomen pelvis last week and history/presentation not consistent with aneurysm/dissection doubt this is etiology of his symptoms today.  On reexamination his abdomen is soft nontender and without peritoneal signs, doubt SBO, perforation, diverticulitis, appendicitis or other emergent etiology of his symptoms today, do not feel additional imaging or work-up is needed in the ER at this time.  Case was discussed with Dr. Rubin Payor who agrees with discharge and outpatient PCP follow-up, will prescribe Protonix for treatment of GERD.  Additionally patient will be following up with cardiology for future echo and event monitor, he had no complaints of palpitations and no tachycardia during visit today do not feel additional work-up is needed in the ER at this time.  At this time there does not appear to be any evidence of an acute emergency medical condition and the patient appears stable for discharge with appropriate outpatient follow up. Diagnosis was discussed with patient who verbalizes understanding of care plan and is agreeable to discharge. I have discussed return precautions with  patient who verbalizes understanding of return precautions. Patient encouraged to follow-up with their PCP.  All questions answered.    Note: Portions of this report may have been transcribed using voice recognition software. Every effort was made to ensure accuracy; however, inadvertent computerized transcription errors may still be present.  Phone interpreter used during this visit.  Final Clinical Impression(s) / ED Diagnoses Final diagnoses:  Abdominal pain, RUQ  Epigastric pain  Gastroesophageal reflux disease, unspecified whether esophagitis present    Rx / DC Orders ED Discharge Orders         Ordered    pantoprazole (PROTONIX) 20 MG tablet  Daily     11/25/19 2145           Elizabeth Palau 11/25/19 2156    Benjiman Core, MD 11/25/19 [phone]

## 2019-11-27 ENCOUNTER — Encounter (INDEPENDENT_AMBULATORY_CARE_PROVIDER_SITE_OTHER): Payer: 59

## 2019-11-27 DIAGNOSIS — R002 Palpitations: Secondary | ICD-10-CM

## 2019-11-30 ENCOUNTER — Other Ambulatory Visit: Payer: Self-pay | Admitting: *Deleted

## 2019-11-30 NOTE — Patient Outreach (Addendum)
[facility]) Care Management  11/30/2019  Timothy Barry [birth year] [id number]  Referral received 1/22 [facility]) Initial outreach 1/25 Specific Interpreter for Arabic: [employer] #[id number]([phone])  Telephone Assessment  Attempted an outreach however unsuccessful. Interpreter able to leave a HIPAA approved voice message requesting a call back if pt has assistance with language barrier.  Plan: Will send outreach letter and attempt another outreach call over the next week using the language interpreter.  Elliot Cousin, RN Care Management Coordinator [facility] Main Office [phone]

## 2019-12-02 ENCOUNTER — Other Ambulatory Visit: Payer: Self-pay

## 2019-12-02 ENCOUNTER — Ambulatory Visit (HOSPITAL_COMMUNITY): Payer: 59 | Attending: Cardiovascular Disease

## 2019-12-02 DIAGNOSIS — R002 Palpitations: Secondary | ICD-10-CM | POA: Diagnosis present

## 2019-12-03 ENCOUNTER — Other Ambulatory Visit: Payer: Self-pay | Admitting: *Deleted

## 2019-12-03 NOTE — Patient Outreach (Signed)
[facility]) Care Management  12/03/2019  Timothy Barry [birth year] [id number]    Pt discussed in Multidisciplinary case discussion.  Elliot Cousin, RN Care Management Coordinator [facility] Main Office [phone]

## 2019-12-07 ENCOUNTER — Other Ambulatory Visit: Payer: Self-pay | Admitting: *Deleted

## 2019-12-07 NOTE — Patient Outreach (Signed)
[facility]) Care Management  12/07/2019  Tavius Turgeon 12-25-1990 [id number]   Outreach #2 Specific Interpreters # [phone] Leila (Arabic)  HIPAA message left via voice message and requested any English speaking person that maybe currently assisting with the pt at this time.  Plan: Will follow up once again over the next week for the 3rd outreach.  Elliot Cousin, RN Care Management Coordinator [facility] Main Office [phone]

## 2019-12-09 ENCOUNTER — Other Ambulatory Visit: Payer: Self-pay

## 2019-12-09 ENCOUNTER — Ambulatory Visit (INDEPENDENT_AMBULATORY_CARE_PROVIDER_SITE_OTHER): Payer: 59 | Admitting: Medical

## 2019-12-09 ENCOUNTER — Encounter: Payer: Self-pay | Admitting: Medical

## 2019-12-09 VITALS — BP 120/80 | HR 78 | Temp 98.8°F | Ht 70.0 in | Wt 289.2 lb

## 2019-12-09 DIAGNOSIS — R079 Chest pain, unspecified: Secondary | ICD-10-CM | POA: Diagnosis not present

## 2019-12-09 DIAGNOSIS — R0789 Other chest pain: Secondary | ICD-10-CM

## 2019-12-09 DIAGNOSIS — K30 Functional dyspepsia: Secondary | ICD-10-CM

## 2019-12-09 DIAGNOSIS — R739 Hyperglycemia, unspecified: Secondary | ICD-10-CM | POA: Insufficient documentation

## 2019-12-09 DIAGNOSIS — K219 Gastro-esophageal reflux disease without esophagitis: Secondary | ICD-10-CM

## 2019-12-09 DIAGNOSIS — R002 Palpitations: Secondary | ICD-10-CM | POA: Diagnosis not present

## 2019-12-09 LAB — POCT GLYCOSYLATED HEMOGLOBIN (HGB A1C): Hemoglobin A1C: 5.7 % — AB (ref 4.0–5.6)

## 2019-12-09 MED ORDER — PANTOPRAZOLE SODIUM 40 MG PO TBEC: DELAYED_RELEASE_TABLET | ORAL | 0 refills | Status: DC

## 2019-12-09 MED ORDER — SUCRALFATE 1 GM/10ML PO SUSP: 1.0000 g | Freq: Three times a day (TID) | ORAL | 0 refills | Status: DC

## 2019-12-09 NOTE — Addendum Note (Signed)
Addended by: Victorio Palm on: 12/09/2019 03:30 PM   Modules accepted: Orders
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
## 2019-12-09 NOTE — Patient Instructions (Addendum)
Recommendations:  Increase Protonix to higher dose 40mg  daily, about 45 minutes before breakfast  Use Sucralfate about 30 minutes before meals to coat inside of stomach  Avoiding eating fast  Avoiding eating within 1 hour of bedtime  Eat smaller portions  Avoid spicy foods, acidic foods, peppers, hot sauce and lots of spices  You can use Tylenol over the counter, 500mg  up to every 6 hours for pain  Continue plan to turn in the heart monitor.  Monitor your pulse rate.   If your pulse is greater than 120 bpm at rest, then recheck sooner  Walk for exercise.   Do some stretching regularly, daily  If the heart test doesn't end up showing anythign abnormal, then you may end up needing to see gastroenterology   Work on losing weight    Gastroesophageal Reflux Disease, Adult   Gastroesophageal reflux disease (GERD) happens when acid from your stomach flows up into the esophagus. When acid comes in contact with the esophagus, the acid causes soreness (inflammation) in the esophagus. Over time, GERD may create small holes (ulcers) in the lining of the esophagus.  CAUSES   Increased body weight. This puts pressure on the stomach, making acid rise from the stomach into the esophagus.   Smoking. This increases acid production in the stomach.   Drinking alcohol. This causes decreased pressure in the lower esophageal sphincter (valve or ring of muscle between the esophagus and stomach), allowing acid from the stomach into the esophagus.   Late evening meals and a full stomach. This increases pressure and acid production in the stomach.   A malformed lower esophageal sphincter.  Sometimes, no cause is found.  SYMPTOMS   Burning pain in the lower part of the mid-chest behind the breastbone and in the mid-stomach area. This may occur twice a week or more often.   Trouble swallowing.   Sore throat.   Dry cough.   Asthma-like symptoms including chest tightness, shortness of  breath, or wheezing.   DIAGNOSIS  Your caregiver may be able to diagnose GERD based on your symptoms. In some cases, X-rays and other tests may be done to check for complications or to check the condition of your stomach and esophagus.  TREATMENT  You may use Protonix.   HOME CARE INSTRUCTIONS   Change the factors that you can control. Ask your caregiver for guidance concerning weight loss, quitting smoking, and alcohol consumption.   Avoid foods and drinks that make your symptoms worse, such as:   Caffeine or alcoholic drinks.   Chocolate.   Peppermint or mint flavorings.   Garlic and onions.   Spicy foods.   Citrus fruits, such as oranges, lemons, or limes.   Tomato-based foods such as sauce, chili, salsa, and pizza.   Fried and fatty foods.   Avoid lying down for the 3 hours prior to your bedtime or prior to taking a nap.   Eat small, frequent meals instead of large meals.   Wear loose-fitting clothing. Do not wear anything tight around your waist that causes pressure on your stomach.   Raise the head of your bed 6 to 8 inches with wood blocks to help you sleep. Extra pillows will not help.   Only take over-the-counter or prescription medicines for pain, discomfort, or fever as directed by your caregiver.   Do not take aspirin, ibuprofen, or other nonsteroidal anti-inflammatory drugs (NSAIDs).   SEEK IMMEDIATE MEDICAL CARE IF:   You have pain in your arms, neck, jaw,  teeth, or back.   Your pain increases or changes in intensity or duration.   You develop nausea, vomiting, or sweating (diaphoresis).   You develop shortness of breath, or you faint.   Your vomit is green, yellow, black, or looks like coffee grounds or blood.   Your stool is red, bloody, or black.  These symptoms could be signs of other problems, such as heart disease, gastric bleeding, or esophageal bleeding. MAKE SURE YOU:   Understand these instructions.   Will watch your condition.    Will get help right away if you are not doing well or get worse.  Document Released: 08/01/2005 Document Revised: 07/04/2011 Document Reviewed: 05/11/2011 [facility] Patient Information 2012 [place], [place].

## 2019-12-11 ENCOUNTER — Other Ambulatory Visit: Payer: Self-pay | Admitting: *Deleted

## 2019-12-11 NOTE — Patient Outreach (Signed)
[facility]) Care Management  12/11/2019  Glennis Montenegro July 07, 1991 [id number]    Outreach #3 RN used [employer] to attempt the 3rd outreach however unsuccessful. Interpreter was able to leave a HIPAA approved voice message requesting a call back. Based upon the unsuccessful call and no return call this case will be closed.   Plan: Will notify the provider and close this case.  Elliot Cousin, RN Care Management Coordinator [facility] Main Office [phone]

## 2019-12-14 ENCOUNTER — Telehealth: Payer: Self-pay

## 2019-12-14 NOTE — Telephone Encounter (Signed)
Received a fax from [employer] for the PA I submitted for the pts. Sucralfate and it was approved from 12/13/19-12/12/20.

## 2019-12-17 ENCOUNTER — Telehealth: Payer: Self-pay | Admitting: Medical

## 2019-12-17 NOTE — Telephone Encounter (Signed)
Message has been sent to patient on mychart.  

## 2019-12-17 NOTE — Telephone Encounter (Signed)
Pt called and states that the medicine carafate is not covered by his insurance please send something else to the [id number] -  ([place]), [place] - [address]

## 2019-12-17 NOTE — Telephone Encounter (Signed)
Ok. Have him continue the Protonix 40mg  higher dose daily, avoid spicy foods, hot sauce, peppers and citrus for now.  Have him call insurance to see if Carafate tablets are covered.  If not, there is no similar prescription medication similar to Carafate liquid or tablet.   Other option would be to add Pepto Bismol or Mylanta oral solution 1-2 times daily over the counter as alternative to Carafate.    F/u in a few weeks for recheck or physical

## 2019-12-21 ENCOUNTER — Other Ambulatory Visit: Payer: Self-pay

## 2019-12-21 ENCOUNTER — Ambulatory Visit (INDEPENDENT_AMBULATORY_CARE_PROVIDER_SITE_OTHER): Payer: 59 | Admitting: Medical

## 2019-12-21 ENCOUNTER — Encounter: Payer: Self-pay | Admitting: Medical

## 2019-12-21 VITALS — BP 120/82 | HR 68 | Temp 98.2°F | Ht 70.0 in | Wt 287.6 lb

## 2019-12-21 DIAGNOSIS — R002 Palpitations: Secondary | ICD-10-CM | POA: Diagnosis not present

## 2019-12-21 DIAGNOSIS — R143 Flatulence: Secondary | ICD-10-CM | POA: Diagnosis not present

## 2019-12-21 DIAGNOSIS — R079 Chest pain, unspecified: Secondary | ICD-10-CM | POA: Diagnosis not present

## 2019-12-21 DIAGNOSIS — R140 Abdominal distension (gaseous): Secondary | ICD-10-CM | POA: Insufficient documentation

## 2019-12-21 NOTE — Progress Notes (Signed)
Subjective:  Timothy Barry is a 29 y.o. male who presents for Chief Complaint  Patient presents with  . Back Pain     Here today with his male cousin  Who helps translate.  I saw him for the first time 12/09/2019.    Yesterday at work at rest felt chest pain, palpations, called EMS.   EMS came out and did rhythms stirp and wrote some notes for [place] to review.  Has been having similar episodes maybe a few times per week, random.  When he feels this episode of chest pain and palpations, feels overall weak, tired, unable to walk even 30 feet when he gets these brief episodes.   This episode yesterday lasted 10 minutes.   Sometimes its 5-10 minutes, not longer.   Feels very tired when this symptom occurs.   No dizziness, no syncope.   Snores sometimes.  cousin says not snoring regularly.  No witnessed apnea.    Denies excessive worry, anxiety, overly stressed.    Been having a lot of gas, stomach making a lot of noise.  Sometimes has loose stool.  No vomiting.    From last visit, for past 2 months having some palpitations, chest and upper back pains, gets ongoing chest pains.  Has been to the emergency dept for this a few times.   Has been put on heart monitor for 30 days and turns this in 12/27/19.  On acid reflux medication for same symptoms.  This has helped some.   Does eat sometimes big portions, close to bedtime.    No physical activity he thinks is related to his chest pain.  No pain with physical activity.   No other aggravating or relieving factors.    No other c/o.  The following portions of the patient's history were reviewed and updated as appropriate: allergies, current medications, past family history, past medical history, past social history, past surgical history and problem list.  ROS Otherwise as in subjective above  Objective: BP 120/82   Pulse 68   Temp 98.2 F (36.8 C)   Ht 5\' 10"  (1.778 m)   Wt 287 lb 9.6 oz (130.5 kg)   SpO2 99%   BMI 41.27 kg/m    BP Readings  from Last 3 Encounters:  12/21/19 120/82  12/09/19 120/80  11/25/19 132/77    General appearance: alert, no distress, well developed, well nourished, [place] male Oral cavity: MMM, no lesions Neck: supple, no lymphadenopathy, questionable thyromegaly, no masses, no JVD or bruit Heart: RRR, normal S1, S2, no murmurs Lungs: CTA bilaterally, no wheezes, rhonchi, or rales Chest wall nontender, normal I:E Abdomen: +bs, soft, mild left lower tendnerss, otherwise non tender, non distended, no masses, no hepatomegaly, no splenomegaly Pulses: 2+ radial pulses, 2+ pedal pulses, normal cap refill Ext: no edema   Assessment: Encounter Diagnoses  Name Primary?  . Palpitation Yes  . Chest pain, unspecified type   . Flatulence   . Bloating      Plan: We discussed his symptoms and concerns.  I reviewed the rhythm strip and EMS notes yesterday where rate was up to 110 bpm and Bp elevated at 160/92 but normal today.  I reviewed his 11/25/19 emergency dept visit and 11/20/19 and 11/22/19 ED visit.  Normal echocardiogram  12/02/19.   CXR normal 11/25/19.  Normal abdominal ultrasound 11/25/19.    Labs from 11/20/19 and 11/25/19 reviewed, with normal troponin's, most recent labs normal blood counts, normal CMET other than glucose elevated and protein slightly elevated.  Lipase normal.    Thyroid TSH normal.    He has event monitor to turn in later this month.    I will send Dr. Jens Som a message about his symptoms today in case he wants to see him sooner.  Anxiety could be playing a role.    He has family back home in [place] he is concerned about.    We discussed his gas and bloating. C/t Protonix, avoid gas producing foods, can use Pepto OTC as needed.  Counseled on diet and exercise recommendations  He will turn in the Holter monitor as planned.  [place] was seen today for back pain.  Diagnoses and all orders for this visit:  Palpitation  Chest pain, unspecified  type  Flatulence  Bloating    Follow up: with cardiology

## 2019-12-21 NOTE — Patient Instructions (Addendum)
Recommendations:  Continue Protonix 40mg  daily, about 45 minutes before breakfast  Since Sucralfate liquid was not covered by insurance, try over the counter Pepto Bismol once or twice daily.  This will make stool look dark  Avoiding eating fast/quickly  Avoiding eating within 1 hour of bedtime  Eat smaller portions  Avoid spicy foods, acidic foods, peppers, hot sauce and lots of spices, avoid deep fried foods, avoid onions, avoid beans that make you gassy   You can use Tylenol over the counter, 500mg  up to every 6 hours for pain  Continue plan to turn in the heart monitor.  Monitor your pulse rate.   If your pulse is greater than 120 bpm at rest, then recheck sooner  Walk for exercise.   Do some stretching regularly, daily  If the heart test doesn't end up showing anything abnormal, then you may end up needing to see gastroenterology  I will email Dr. Stanford Breed cardiology about your symptoms to see if he wants you to do anything different currently  If needed you can use over the counter Gas X   Try using this guide below  Exercise  I recommend walking, bicycling, hiking or other exercise 30-45 minutes at least 3 days per week.  Ideally gradually work up to exercise most days per week.   Breakfast You may eat 1 of the following  Smoothie with Almond milk, handful of kale or spinach, and 1-2 fruit servings of your choice such as berries or 1/2 banana  Whole grain slice of toast and thin layer of low sugar jam or small amount of honey  Whole grain slice of toast and avocado spread  1/2 cup of steel cut oats (oatmeal)   Mid-morning snack 1 fruit serving such as one of the following:  medium-sized apple  medium-sized orange,  Tangerine  1/2 banana   3/4 cup of fresh berries or frozen berries  A protein source such as one of the following:  8 almonds   small handful of walnuts or other nuts  Hummus and vegetable such as carrots   Lunch A protein  source such as 1 of the following: . 1 serving of beans such as black beans, green beans, or edamame (soy beans) . Veggie burger  . Non breaded fish such as salmon or tuna, either baked, grilled, or broiled Vegetable - Half of your plate should be a non-starchy vegetables!  So avoid white potatoes and corn.  Otherwise, eat a large portion of vegetables. . Avocado, cucumber, tomato, carrots, greens, lettuce, squash, okra, etc.  . Vegetables can include salad with olive oil/vinaigrette dressing Grains such as 1/2 cup of brown rice, quinoa, barley or other whole grain or 1 or 2 slices of whole grain bread   Mid-afternoon snack 1 fruit serving such as one of the following:  medium-sized apple  medium-sized orange,  Tangerine  1/2 banana   3/4 cup of fresh berries or frozen berries  A protein source such as one of the following:  8 almonds   small handful of walnuts or other nuts  Hummus and vegetable such as carrots   Dinner A protein source such as 1 of the following: . 1 serving of beans such as black beans, green beans, or edamame (soy beans) . Veggie burger  . Non breaded fish such as salmon or tuna, either baked, grilled, or broiled Vegetable - Half of your plate should be a non-starchy vegetables!  So avoid white potatoes and corn.  Otherwise, eat a  large portion of vegetables. . Avocado, cucumber, tomato, carrots, greens, lettuce, squash, okra, etc.  . Vegetables can include salad with olive oil/vinaigrette dressing Grains such as 1/2 cup of brown rice, quinoa, barley or other whole grain or 1 or 2 slices of whole grain bread   Beverages: Water Unsweet tea Home made juice with a juicer without sugar added other than small bit of honey or agave nectar Water with sugar free flavor such as M[place]   AVOID.... For the time being I want you to cut out the following items completely: . Soda, sweet tea, juice, beer or wine or alcohol . Sweets such as cake, candy, pies,  chips, cookies, chocolate

## 2019-12-23 ENCOUNTER — Encounter: Payer: Self-pay | Admitting: Medical

## 2019-12-31 ENCOUNTER — Telehealth (INDEPENDENT_AMBULATORY_CARE_PROVIDER_SITE_OTHER): Payer: 59 | Admitting: Cardiology

## 2019-12-31 ENCOUNTER — Encounter: Payer: Self-pay | Admitting: Cardiology

## 2019-12-31 VITALS — Ht 70.0 in

## 2019-12-31 DIAGNOSIS — R000 Tachycardia, unspecified: Secondary | ICD-10-CM | POA: Diagnosis not present

## 2019-12-31 DIAGNOSIS — K219 Gastro-esophageal reflux disease without esophagitis: Secondary | ICD-10-CM

## 2019-12-31 DIAGNOSIS — R002 Palpitations: Secondary | ICD-10-CM

## 2019-12-31 NOTE — Progress Notes (Signed)
Virtual Visit via Telephone Note   This visit type was conducted due to national recommendations for restrictions regarding the COVID-19 Pandemic (e.g. social distancing) in an effort to limit this patient's exposure and mitigate transmission in our community.  Due to his co-morbid illnesses, this patient is at least at moderate risk for complications without adequate follow up.  This format is felt to be most appropriate for this patient at this time.  The patient did not have access to video technology/had technical difficulties with video requiring transitioning to audio format only (telephone).  All issues noted in this document were discussed and addressed.  No physical exam could be performed with this format.  Please refer to the patient's chart for his  consent to telehealth for [facility].   Date:  12/31/2019   ID:  Timothy Barry, DOB 12/13/1990, MRN [id number]  Patient Location: Home Provider Location: Home  PCP:  Jac Canavan, PA-C  Cardiologist:  Dr Jens Som Electrophysiologist:  None   Evaluation Performed:  Follow-Up Visit  Chief Complaint:  none  History of Present Illness:    Timothy Barry is a 29 y.o. male who is from [place] who was seen in the ED 11/25/2019 for palpitations and back pain. His Initial heart rate was 160 by report, his EKG confirmed narrow complex tachycardia at 162.  This improved with hydration.  CTA showed no pulmonary embolus or dissection.  Troponins normal.  TSH normal, potassium 3.2, hemoglobin 15.8.  Patient was hydrated and referred to cardiology for further evaluation.  Dr Jens Som saw him in the office 11/24/2019 and ordered an echo and monitor.  The patient was contacted today to discuss the results.   Echo 12/02/2019 showed normal LVF with an EF of 60-65%, no LVH, mild LAE, normal RVF. His monitor showed NSR-80's, occasional PAC and PVC.  I explained these results to the patient.  He denied any new complaints.     The patient does not  have symptoms concerning for COVID-19 infection (fever, chills, cough, or new shortness of breath).    Past Medical History:  Diagnosis Date  . Abnormal EKG 11/27/2018  . Palpitation 11/27/2018   Past Surgical History:  Procedure Laterality Date  . No prior surgery       Current Meds  Medication Sig  . aspirin EC 81 MG tablet Take 81 mg by mouth daily.  . pantoprazole (PROTONIX) 40 MG tablet 1 tablet daily po 45 min prior to breakfast  . sucralfate (CARAFATE) 1 GM/10ML suspension Take 10 mLs (1 g total) by mouth 4 (four) times daily -  with meals and at bedtime.  . [DISCONTINUED] pantoprazole (PROTONIX) 20 MG tablet Take 20 mg by mouth daily.     Allergies:   Patient has no known allergies.   Social History   Tobacco Use  . Smoking status: Former Games developer  . Smokeless tobacco: Never Used  Substance Use Topics  . Alcohol use: Never  . Drug use: Never     Family Hx: The patient's family history includes Cancer in his father; Heart disease (age of onset: 81) in his maternal grandmother.  ROS:   Please see the history of present illness.    All other systems reviewed and are negative.   Prior CV studies:   The following studies were reviewed today: Echo 12/02/2019 Monitor 12/30/2019  Labs/Other Tests and Data Reviewed:    EKG:  An ECG dated 12/20/2019 was personally reviewed today and demonstrated:  NSR, HR 75  Recent Labs:  11/21/2019: TSH 0.603 11/25/2019: ALT 19; BUN 9; Creatinine, Ser 0.65; Hemoglobin 15.5; Platelets 382; Potassium 3.8; Sodium 138   Recent Lipid Panel No results found for: CHOL, TRIG, HDL, CHOLHDL, LDLCALC, LDLDIRECT  Wt Readings from Last 3 Encounters:  12/21/19 287 lb 9.6 oz (130.5 kg)  12/09/19 289 lb 3.2 oz (131.2 kg)  11/24/19 286 lb (129.7 kg)     Objective:    Vital Signs:  Ht 5\' 10"  (1.778 m)   BMI 41.27 kg/m    VITAL SIGNS:  reviewed  ASSESSMENT & PLAN:    Tachycardia- Seen in Ed with sinus tachycardia 160- ? Anxiety, ?  Dehydration, ?secondary to back pain. No arrhythmia on monitor, echo normal, f/u EKG normal.  Pt reassured.  COVID-19 Education: The signs and symptoms of COVID-19 were discussed with the patient and how to seek care for testing (follow up with PCP or arrange E-visit).  The importance of social distancing was discussed today.  Time:   Today, I have spent 10 minutes with the patient with telehealth technology discussing the above problems.     Medication Adjustments/Labs and Tests Ordered: Current medicines are reviewed at length with the patient today.  Concerns regarding medicines are outlined above.   Tests Ordered: No orders of the defined types were placed in this encounter.   Medication Changes: No orders of the defined types were placed in this encounter.   Follow Up:  Either In Person or Virtual prn  Signed, Kerin Ransom, PA-C  12/31/2019 10:10 AM    [place]

## 2019-12-31 NOTE — Patient Instructions (Signed)
Medication Instructions:  Your physician recommends that you continue on your current medications as directed. Please refer to the Current Medication list given to you today.  *If you need a refill on your cardiac medications before your next appointment, please call your pharmacy*   Follow-Up: At [facility], you and your health needs are our priority.  As part of our continuing mission to provide you with exceptional heart care, we have created designated Provider Care Teams.  These Care Teams include your primary Cardiologist (physician) and Advanced Practice Providers (APPs -  Physician Assistants and Nurse Practitioners) who all work together to provide you with the care you need, when you need it.  Your next appointment:   Follow up as needed.

## 2020-01-04 ENCOUNTER — Ambulatory Visit: Payer: 59 | Admitting: Cardiology

## 2020-01-26 ENCOUNTER — Telehealth: Payer: Self-pay | Admitting: Medical

## 2020-01-26 NOTE — Telephone Encounter (Signed)
Please call  Pt never received GI referral and still having issues

## 2020-01-26 NOTE — Telephone Encounter (Signed)
Please refer to gastroenterology for chronic abdominal pain, GERD, not improving with reasonable measures

## 2020-01-27 ENCOUNTER — Encounter: Payer: Self-pay | Admitting: Physician Assistant

## 2020-01-27 ENCOUNTER — Emergency Department (HOSPITAL_COMMUNITY): Payer: 59

## 2020-01-27 ENCOUNTER — Other Ambulatory Visit: Payer: Self-pay

## 2020-01-27 ENCOUNTER — Emergency Department (HOSPITAL_COMMUNITY)
Admission: EM | Admit: 2020-01-27 | Discharge: 2020-01-28 | Disposition: A | Discharge status: Home / Self Care | Payer: 59 | Attending: Emergency Medicine | Admitting: Emergency Medicine

## 2020-01-27 DIAGNOSIS — K219 Gastro-esophageal reflux disease without esophagitis: Secondary | ICD-10-CM

## 2020-01-27 DIAGNOSIS — T148XXA Other injury of unspecified body region, initial encounter: Secondary | ICD-10-CM

## 2020-01-27 DIAGNOSIS — M549 Dorsalgia, unspecified: Secondary | ICD-10-CM | POA: Insufficient documentation

## 2020-01-27 DIAGNOSIS — Z79899 Other long term (current) drug therapy: Secondary | ICD-10-CM | POA: Insufficient documentation

## 2020-01-27 DIAGNOSIS — Z87891 Personal history of nicotine dependence: Secondary | ICD-10-CM | POA: Diagnosis not present

## 2020-01-27 DIAGNOSIS — R0789 Other chest pain: Secondary | ICD-10-CM | POA: Diagnosis present

## 2020-01-27 DIAGNOSIS — R1013 Epigastric pain: Secondary | ICD-10-CM | POA: Diagnosis not present

## 2020-01-27 DIAGNOSIS — G8929 Other chronic pain: Secondary | ICD-10-CM

## 2020-01-27 LAB — CBC
HCT: 47.3 % (ref 39.0–52.0)
Hemoglobin: 15 g/dL (ref 13.0–17.0)
MCH: 27.1 pg (ref 26.0–34.0)
MCHC: 31.7 g/dL (ref 30.0–36.0)
MCV: 85.4 fL (ref 80.0–100.0)
Platelets: 326 10*3/uL (ref 150–400)
RBC: 5.54 MIL/uL (ref 4.22–5.81)
RDW: 13.2 % (ref 11.5–15.5)
WBC: 7.4 10*3/uL (ref 4.0–10.5)
nRBC: 0 % (ref 0.0–0.2)

## 2020-01-27 LAB — TROPONIN I (HIGH SENSITIVITY): Troponin I (High Sensitivity): 2 ng/L (ref <18)

## 2020-01-27 LAB — BASIC METABOLIC PANEL
Anion gap: 7 (ref 5–15)
BUN: 8 mg/dL (ref 6–20)
CO2: 29 mmol/L (ref 22–32)
Calcium: 9.1 mg/dL (ref 8.9–10.3)
Chloride: 104 mmol/L (ref 98–111)
Creatinine, Ser: 0.74 mg/dL (ref 0.61–1.24)
GFR calc Af Amer: >60 mL/min (ref >60)
GFR calc non Af Amer: >60 mL/min (ref >60)
Glucose, Bld: 103 mg/dL — ABNORMAL HIGH (ref 70–99)
Potassium: 4.1 mmol/L (ref 3.5–5.1)
Sodium: 140 mmol/L (ref 135–145)

## 2020-01-27 MED ORDER — SUCRALFATE 1 GM/10ML PO SUSP: 1.0000 g | Freq: Three times a day (TID) | ORAL | Status: DC

## 2020-01-27 MED ORDER — LIDOCAINE VISCOUS HCL 2 % MT SOLN
15.0000 mL | Freq: Once | OROMUCOSAL | Status: AC
  Administered 2020-01-27: 15 mL via ORAL
  Filled 2020-01-27: qty 15

## 2020-01-27 MED ORDER — ALUM & MAG HYDROXIDE-SIMETH 200-200-20 MG/5ML PO SUSP
30.0000 mL | Freq: Once | ORAL | Status: AC
  Administered 2020-01-27: 30 mL via ORAL
  Filled 2020-01-27: qty 30

## 2020-01-27 MED ORDER — SUCRALFATE 1 GM/10ML PO SUSP
1.0000 g | Freq: Once | ORAL | Status: AC
  Administered 2020-01-27: 1 g via ORAL
  Filled 2020-01-27: qty 10

## 2020-01-27 NOTE — ED Triage Notes (Signed)
Pt reports upper back pain that started x2 days ago and started radiating to chest. Pt also reports 4 episodes of diarrhea today. Pt reports hx of indigestion and takes medication for it.

## 2020-01-27 NOTE — Telephone Encounter (Signed)
Referral has been placed to GI

## 2020-01-28 ENCOUNTER — Encounter (HOSPITAL_COMMUNITY): Payer: Self-pay | Admitting: Emergency Medicine

## 2020-01-28 MED ORDER — ACETAMINOPHEN 500 MG PO TABS
1000.0000 mg | ORAL_TABLET | Freq: Once | ORAL | Status: AC
  Administered 2020-01-28: 1000 mg via ORAL
  Filled 2020-01-28: qty 2

## 2020-01-28 MED ORDER — PANTOPRAZOLE SODIUM 40 MG PO TBEC: 40.0000 mg | DELAYED_RELEASE_TABLET | Freq: Two times a day (BID) | ORAL | 0 refills | Status: DC

## 2020-01-28 NOTE — ED Provider Notes (Signed)
[facility]-EMERGENCY DEPT Provider Note   CSN: [id number] Arrival date & time: 01/27/20  2029     History Chief Complaint  Patient presents with  . Chest Pain  . Back Pain    Timothy Barry is a 29 y.o. male.  The history is provided by the patient.  Chest Pain Pain location:  Epigastric and substernal area Pain quality: pressure   Pain quality: not sharp and not tearing   Pain quality comment:  Bloating Pain severity:  Severe Onset quality:  Gradual Timing:  Constant Progression:  Unchanged Chronicity:  Recurrent Context: at rest   Context: not breathing   Relieved by:  Nothing Worsened by:  Nothing Ineffective treatments: home protonix. Associated symptoms: heartburn   Associated symptoms: no abdominal pain, no AICD problem, no altered mental status, no anorexia, no anxiety, no claudication, no cough, no diaphoresis, no dizziness, no dysphagia, no fatigue, no lower extremity edema, no nausea, no near-syncope, no numbness, no orthopnea, no palpitations, no PND, no shortness of breath, no syncope, no vomiting and no weakness   Associated symptoms comment:  Loose stool x 4 today Risk factors: male sex   Risk factors: no aortic disease   Risk factors comment:  Gerd  Patient with known GERD that is not controlled on daily protonix but patient cannot afford carafate presents with ongoing symptoms for several days and today had associated loose stool.  He also has a spot on his upper back that hurts that is not contiguous with the pain in the epigastric area and chest.  He is not taking maalox nor peptobismol.       Past Medical History:  Diagnosis Date  . Abnormal EKG 11/27/2018  . Palpitation 11/27/2018    Patient Active Problem List   Diagnosis Date Noted  . Flatulence 12/21/2019  . Bloating 12/21/2019  . Chest pain 12/09/2019  . Hyperglycemia 12/09/2019  . Gastroesophageal reflux disease 12/09/2019  . 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  Abnormal EKG 11/27/2018  . Palpitation 11/27/2018    Past Surgical History:  Procedure Laterality Date  . No prior surgery         Family History  Problem Relation Age of Onset  . Cancer Father   . Heart disease Maternal Grandmother 81       MI    Social History   Tobacco Use  . Smoking status: Former Games developer  . Smokeless tobacco: Never Used  Substance Use Topics  . Alcohol use: Never  . Drug use: Never    Home Medications Prior to Admission medications   Medication Sig Start Date End Date Taking? Authorizing Provider  aspirin EC 81 MG tablet Take 81 mg by mouth every 4 (four) hours as needed for mild pain.    Yes [provider]  pantoprazole (PROTONIX) 40 MG tablet 1 tablet daily po 45 min prior to breakfast Patient taking differently: Take 40 mg by mouth daily. 45 min prior to breakfast 12/09/19  Yes Tysinger, Kermit Balo, PA-C  sucralfate (CARAFATE) 1 GM/10ML suspension Take 10 mLs (1 g total) by mouth 4 (four) times daily -  with meals and at bedtime. 12/09/19   Tysinger, Kermit Balo, PA-C    Allergies    Patient has no known allergies.  Review of Systems   Review of Systems  Constitutional: Negative for diaphoresis and fatigue.  HENT: Negative for congestion and trouble swallowing.   Eyes: Negative for visual disturbance.  Respiratory: Negative for cough and shortness of breath.  Cardiovascular: Negative for palpitations, orthopnea, claudication, syncope, PND and near-syncope.  Gastrointestinal: Positive for heartburn. Negative for abdominal pain, anorexia, nausea and vomiting.  Genitourinary: Negative for difficulty urinating.  Musculoskeletal: Negative for arthralgias.  Skin: Negative for rash.  Neurological: Negative for dizziness, weakness and numbness.  Psychiatric/Behavioral: Negative for agitation.  All other systems reviewed and are negative.   Physical Exam Updated Vital Signs BP 139/80 (BP Location: Right Arm)   Pulse 73   Temp 98.6 F (37 C)  (Oral)   Resp 16   Ht 5\' 10"  (1.778 m)   Wt 131.5 kg   SpO2 100%   BMI 41.61 kg/m   Physical Exam Vitals and nursing note reviewed.  Constitutional:      General: He is not in acute distress.    Appearance: Normal appearance.  HENT:     Head: Normocephalic and atraumatic.     Nose: Nose normal.  Eyes:     Conjunctiva/sclera: Conjunctivae normal.     Pupils: Pupils are equal, round, and reactive to light.  Cardiovascular:     Rate and Rhythm: Normal rate and regular rhythm.     Pulses: Normal pulses.     Heart sounds: Normal heart sounds.  Pulmonary:     Effort: Pulmonary effort is normal.     Breath sounds: Normal breath sounds.  Abdominal:     General: Abdomen is flat.     Palpations: Abdomen is soft.     Tenderness: There is no abdominal tenderness. There is no guarding or rebound.     Comments: Increased BS in the upper abdomen  Musculoskeletal:        General: Normal range of motion.     Cervical back: Normal, normal range of motion and neck supple.     Thoracic back: Normal.  Skin:    General: Skin is warm and dry.     Capillary Refill: Capillary refill takes less than 2 seconds.  Neurological:     General: No focal deficit present.     Mental Status: He is alert and oriented to person, place, and time.  Psychiatric:        Mood and Affect: Mood normal.        Behavior: Behavior normal.     ED Results / Procedures / Treatments   Labs (all labs ordered are listed, but only abnormal results are displayed) Results for orders placed or performed during the hospital encounter of 87/56/43  Basic metabolic panel  Result Value Ref Range   Sodium 140 135 - 145 mmol/L   Potassium 4.1 3.5 - 5.1 mmol/L   Chloride 104 98 - 111 mmol/L   CO2 29 22 - 32 mmol/L   Glucose, Bld 103 (H) 70 - 99 mg/dL   BUN 8 6 - 20 mg/dL   Creatinine, Ser 0.74 0.61 - 1.24 mg/dL   Calcium 9.1 8.9 - 10.3 mg/dL   GFR calc non Af Amer >60 >60 mL/min   GFR calc Af Amer >60 >60 mL/min    Anion gap 7 5 - 15  CBC  Result Value Ref Range   WBC 7.4 4.0 - 10.5 K/uL   RBC 5.54 4.22 - 5.81 MIL/uL   Hemoglobin 15.0 13.0 - 17.0 g/dL   HCT 47.3 39.0 - 52.0 %   MCV 85.4 80.0 - 100.0 fL   MCH 27.1 26.0 - 34.0 pg   MCHC 31.7 30.0 - 36.0 g/dL   RDW 13.2 11.5 - 15.5 %   Platelets 326 150 -  400 K/uL   nRBC 0.0 0.0 - 0.2 %  Troponin I (High Sensitivity)  Result Value Ref Range   Troponin I (High Sensitivity) 2 <18 ng/L   DG Chest 2 View  Result Date: 01/27/2020 CLINICAL DATA:  Chest pain EXAM: CHEST - 2 VIEW COMPARISON:  None. FINDINGS: Heart and mediastinal contours are within normal limits. No focal opacities or effusions. No acute bony abnormality. IMPRESSION: Normal study. Electronically Signed   By: Charlett Nose M.D.   On: 01/27/2020 21:54   Cardiac event monitor  Result Date: 12/30/2019 Sinus bradycardia, normal sinus rhythm, sinus tachycardia, PACs and PVCs. Olga Millers    EKG EKG Interpretation  Date/Time:  Wednesday January 27 2020 21:15:20 EDT Ventricular Rate:  82 PR Interval:    QRS Duration: 91 QT Interval:  345 QTC Calculation: 403 R Axis:   94 Text Interpretation: Sinus rhythm ST elev, probable normal early repol pattern Confirmed by Chenita Ruda ([id number]) on 01/27/2020 11:35:45 PM   Radiology DG Chest 2 View  Result Date: 01/27/2020 CLINICAL DATA:  Chest pain EXAM: CHEST - 2 VIEW COMPARISON:  None. FINDINGS: Heart and mediastinal contours are within normal limits. No focal opacities or effusions. No acute bony abnormality. IMPRESSION: Normal study. Electronically Signed   By: Charlett Nose M.D.   On: 01/27/2020 21:54    Procedures Procedures (including critical care time)  Medications Ordered in ED Medications  alum & mag hydroxide-simeth (MAALOX/MYLANTA) 200-200-20 MG/5ML suspension 30 mL (30 mLs Oral Given 01/27/20 2348)    And  lidocaine (XYLOCAINE) 2 % viscous mouth solution 15 mL (15 mLs Oral Given 01/27/20 2348)  sucralfate (CARAFATE) 1 GM/10ML  suspension 1 g (1 g Oral Given 01/27/20 2348)  acetaminophen (TYLENOL) tablet 1,000 mg (1,000 mg Oral Given 01/28/20 0027)    ED Course  I have reviewed the triage vital signs and the nursing notes.  Pertinent labs & imaging results that were available during my care of the patient were reviewed by me and considered in my medical decision making (see chart for details).   Pain is markedly improved post medication.  Herat score is one very low risk for MACE.  PERC negative wells 0 highly doubt PE in this low risk patient.  Symptoms are highly atypical for cardiac etiology and one troponin in the setting of ongoing symptom is sufficient to exclude ACS.  Symptoms are consistent with patient's know gerd and msk pain. Will increase his protonix to bid and have patient follow up with GI   Mandy Fitzwater was evaluated in Emergency Department on 01/28/2020 for the symptoms described in the history of present illness. He was evaluated in the context of the global COVID-19 pandemic, which necessitated consideration that the patient might be at risk for infection with the SARS-CoV-2 virus that causes COVID-19. Institutional protocols and algorithms that pertain to the evaluation of patients at risk for COVID-19 are in a state of rapid change based on information released by regulatory bodies including the CDC and federal and state organizations. These policies and algorithms were followed during the patient's care in the ED.  Final Clinical Impression(s) / ED Diagnoses Return for weakness, numbness, changes in vision or speech, fevers >100.4 unrelieved by medication, shortness of breath, intractable vomiting, or diarrhea, abdominal pain, Inability to tolerate liquids or food, cough, altered mental status or any concerns. No signs of systemic illness or infection. The patient is nontoxic-appearing on exam and vital signs are within normal limits.   I have reviewed the triage  vital signs and the nursing notes.  Pertinent labs &imaging results that were available during my care of the patient were reviewed by me and considered in my medical decision making (see chart for details).  After history, exam, and medical workup I feel the patient has been appropriately medically screened and is safe for discharge home. Pertinent diagnoses were discussed with the patient. Patient was givenstrictreturn precautions.   Kaya Pottenger, MD 01/28/20 9914

## 2020-01-29 ENCOUNTER — Other Ambulatory Visit: Payer: Self-pay

## 2020-01-29 MED ORDER — SUCRALFATE 1 GM/10ML PO SUSP: 1.0000 g | Freq: Three times a day (TID) | ORAL | 0 refills | Status: DC

## 2020-02-09 ENCOUNTER — Encounter: Payer: Self-pay | Admitting: Physician Assistant

## 2020-02-09 ENCOUNTER — Ambulatory Visit (INDEPENDENT_AMBULATORY_CARE_PROVIDER_SITE_OTHER): Payer: 59 | Admitting: Physician Assistant

## 2020-02-09 VITALS — BP 110/68 | HR 88 | Temp 99.0°F | Ht 71.0 in | Wt 281.0 lb

## 2020-02-09 DIAGNOSIS — K625 Hemorrhage of anus and rectum: Secondary | ICD-10-CM

## 2020-02-09 DIAGNOSIS — R194 Change in bowel habit: Secondary | ICD-10-CM | POA: Diagnosis not present

## 2020-02-09 DIAGNOSIS — Z01818 Encounter for other preprocedural examination: Secondary | ICD-10-CM

## 2020-02-09 DIAGNOSIS — R1013 Epigastric pain: Secondary | ICD-10-CM | POA: Diagnosis not present

## 2020-02-09 DIAGNOSIS — R110 Nausea: Secondary | ICD-10-CM | POA: Diagnosis not present

## 2020-02-09 MED ORDER — NA SULFATE-K SULFATE-MG SULF 17.5-3.13-1.6 GM/177ML PO SOLN: 1.0000 | Freq: Once | ORAL | 0 refills | Status: AC

## 2020-02-09 NOTE — Patient Instructions (Addendum)
If you are age 30 or older, your body mass index should be between 23-30. Your Body mass index is 39.19 kg/m. If this is out of the aforementioned range listed, please consider follow up with your Primary Care Provider.  If you are age 32 or younger, your body mass index should be between 19-25. Your Body mass index is 39.19 kg/m. If this is out of the aformentioned range listed, please consider follow up with your Primary Care Provider.   Continue Pantprazole 40 mg twice daily. Continue Carafate 1 g take 1 hour before meals or 2 hours after meals.

## 2020-02-09 NOTE — Progress Notes (Signed)
Chief Complaint: Epigastric pain, rectal bleeding and change in bowel habits  HPI:    Mr. Ridings is a 29 year old Arabic male with a past medical history significant for GERD and previous H. pylori infection, who was referred to me by Jac Canavan, PA-C for a complaint of epigastric pain, rectal bleeding and change in bowel habits.      01/29/2020 patient seen in the ED for abdominal pain.  Reported progressive epigastric abdominal pain for weeks, this pain was sharp and accompanied by pain between his shoulder blades worse after eating, particularly fatty meals.  Also with abdominal bloating and intermittent pressure in his throat and chest.  At that time reported history of H. pylori infection in 2019 for which she completed therapy.  Labs including CBC, CMP and lipase were all normal.  Continued on Pantoprazole and given Carafate and told to take Maalox.    Today, the patient tells me that over the past 2 months he has developed an epigastric pain which seems to radiate through to his back, rated as a constant 6-7/10.  This is worse when he eats anything.  He tells me it feels very sharp in nature and is unhelped by his Pantoprazole 40 mg twice a day as well as Carafate and occasional Maalox.  Along with this has been experiencing some early satiety and bloating with nausea.  Tells me he has dropped 5 pounds over the past couple of months.  He has tried to decrease the amount of fatty greasy foods he has been eating over the past 2 weeks and this is not helped.    Also tell me that his bowel movements have changed and he will occasionally now have diarrhea mixed with his normal stools and for the past 10 days has seen some bright red blood in the toilet bowl "just a little bit" and tells me that his stools look different and have a very strong odor.    Family history of some sort of stomach cancer in his paternal grandfather and paternal aunt.    Denies fever, chills or symptoms that awaken him  from sleep.     Past Medical History:  Diagnosis Date  . Abnormal EKG 11/27/2018  . Palpitation 11/27/2018    Past Surgical History:  Procedure Laterality Date  . No prior surgery      Current Outpatient Medications  Medication Sig Dispense Refill  . aspirin EC 81 MG tablet Take 81 mg by mouth every 4 (four) hours as needed for mild pain.     . pantoprazole (PROTONIX) 40 MG tablet 1 tablet daily po 45 min prior to breakfast (Patient taking differently: Take 40 mg by mouth daily. 45 min prior to breakfast) 30 tablet 0  . pantoprazole (PROTONIX) 40 MG tablet Take 1 tablet (40 mg total) by mouth 2 (two) times daily. 60 tablet 0  . sucralfate (CARAFATE) 1 GM/10ML suspension Take 10 mLs (1 g total) by mouth 4 (four) times daily -  with meals and at bedtime. 420 mL 0   No current facility-administered medications for this visit.    Allergies as of 02/09/2020  . (No Known Allergies)    Family History  Problem Relation Age of Onset  . Cancer Father   . Heart disease Maternal Grandmother 42       MI    Social History   Socioeconomic History  . Marital status: Single    Spouse name: Not on file  . Number of children: Not on  file  . Years of education: Not on file  . Highest education level: Not on file  Occupational History  . Not on file  Tobacco Use  . Smoking status: Former Research scientist (life sciences)  . Smokeless tobacco: Never Used  Substance and Sexual Activity  . Alcohol use: Never  . Drug use: Never  . Sexual activity: Not on file  Other Topics Concern  . Not on file  Social History Narrative  . Not on file   Social Determinants of Health   Financial Resource Strain:   . Difficulty of Paying Living Expenses:   Food Insecurity:   . Worried About Charity fundraiser in the Last Year:   . Arboriculturist in the Last Year:   Transportation Needs:   . Film/video editor (Medical):   Marland Kitchen Lack of Transportation (Non-Medical):   Physical Activity:   . Days of Exercise per Week:     . Minutes of Exercise per Session:   Stress:   . Feeling of Stress :   Social Connections:   . Frequency of Communication with Friends and Family:   . Frequency of Social Gatherings with Friends and Family:   . Attends Religious Services:   . Active Member of Clubs or Organizations:   . Attends Archivist Meetings:   Marland Kitchen Marital Status:   Intimate Partner Violence:   . Fear of Current or Ex-Partner:   . Emotionally Abused:   Marland Kitchen Physically Abused:   . Sexually Abused:     Review of Systems:    Constitutional: No weight loss, fever or chills Skin: No rash  Cardiovascular: No chest pain  Respiratory: No SOB Gastrointestinal: See HPI and otherwise negative Genitourinary: No dysuria Neurological: No headache, dizziness or syncope Musculoskeletal: No new muscle or joint pain Hematologic: No bruising Psychiatric: No history of depression or anxiety   Physical Exam:  Vital signs: BP 110/68 (BP Location: Left Arm, Patient Position: Sitting, Cuff Size: Normal)   Pulse 88   Temp 99 F (37.2 C)   Ht 5\' 11"  (1.803 m) Comment: height measured without shoes  Wt 281 lb (127.5 kg)   BMI 39.19 kg/m   Constitutional:   Pleasant overweight male appears to be in NAD, Well developed, Well nourished, alert and cooperative Head:  Normocephalic and atraumatic. Eyes:   PEERL, EOMI. No icterus. Conjunctiva pink. Ears:  Normal auditory acuity. Neck:  Supple Throat: Oral cavity and pharynx without inflammation, swelling or lesion.  Respiratory: Respirations even and unlabored. Lungs clear to auscultation bilaterally.   No wheezes, crackles, or rhonchi.  Cardiovascular: Normal S1, S2. No MRG. Regular rate and rhythm. No peripheral edema, cyanosis or pallor.  Gastrointestinal:  Soft, nondistended, nontender. No rebound or guarding. Normal bowel sounds. No appreciable masses or hepatomegaly. Rectal:  External: some small excoiations on buttocks near rectum, moist skin; Internal: no mass or  ttp Msk:  Symmetrical without gross deformities. Without edema, no deformity or joint abnormality.  Neurologic:  Alert and  oriented x4;  grossly normal neurologically.  Skin:   Dry and intact without significant lesions or rashes. Psychiatric: Demonstrates good judgement and reason without abnormal affect or behaviors.  RELEVANT LABS AND IMAGING: CBC    Component Value Date/Time   WBC 7.4 01/27/2020 2313   RBC 5.54 01/27/2020 2313   HGB 15.0 01/27/2020 2313   HCT 47.3 01/27/2020 2313   PLT 326 01/27/2020 2313   MCV 85.4 01/27/2020 2313   MCH 27.1 01/27/2020 2313   MCHC  31.7 01/27/2020 2313   RDW 13.2 01/27/2020 2313   LYMPHSABS 2.5 11/25/2019 1827   MONOABS 0.6 11/25/2019 1827   EOSABS 0.5 11/25/2019 1827   BASOSABS 0.0 11/25/2019 1827    CMP     Component Value Date/Time   NA 140 01/27/2020 2313   K 4.1 01/27/2020 2313   CL 104 01/27/2020 2313   CO2 29 01/27/2020 2313   GLUCOSE 103 (H) 01/27/2020 2313   BUN 8 01/27/2020 2313   CREATININE 0.74 01/27/2020 2313   CALCIUM 9.1 01/27/2020 2313   PROT 8.4 (H) 11/25/2019 1827   ALBUMIN 4.2 11/25/2019 1827   AST 22 11/25/2019 1827   ALT 19 11/25/2019 1827   ALKPHOS 62 11/25/2019 1827   BILITOT 0.6 11/25/2019 1827   GFRNONAA >60 01/27/2020 2313   GFRAA >60 01/27/2020 2313    Assessment: 1.  Epigastric pain: Worse over the past 2 months no change with Pantoprazole 40 mg twice daily, radiates through to his back, recently labs normal including lipase; consider PUD versus gastritis versus functional dyspepsia versus other 2.  Rectal bleeding: For the past 10 days with every bowel movement, typically in the toilet water, nothing obvious on exam today; consider internal hemorrhoids versus other 3.  Change in bowel habits: Occasional diarrhea and feces smells and looks different for the patient; consider IBS versus difference with the medications 4.  Nausea  Plan: 1.  Had a long discussion with the patient.  He is anxious about  his symptoms.  Recommend we proceed with diagnostic EGD and colonoscopy in the [facility] with Dr. Orvan Falconer.  Did discuss risks, benefits, limitations and alternatives and the patient agrees to proceed.  He will be Covid tested 2 days prior to time of procedures. 2.  Continue Pantoprazole 40 mg twice daily and Carafate for now.  Discussed timing of Carafate taking it an hour before or 2 hours after eating and other medications. 3.  Reviewed antireflux diet and lifestyle modifications. 4.  Patient to follow in clinic per recommendations from Dr. Orvan Falconer after time of procedures.  Hyacinth Meeker, PA-C [facility] 02/09/2020, 9:59 AM  Cc: Jac Canavan, PA-C

## 2020-02-09 NOTE — Progress Notes (Signed)
Reviewed and agree with management plans. Please see if an earlier [facility] appointment is available. Thank you.   Marx Doig L. Orvan Falconer, MD, MPH

## 2020-02-27 ENCOUNTER — Ambulatory Visit: Payer: 59 | Attending: Internal Medicine

## 2020-02-27 DIAGNOSIS — Z23 Encounter for immunization: Secondary | ICD-10-CM

## 2020-02-27 NOTE — Progress Notes (Signed)
   Covid-19 Vaccination Clinic  Name:  Timothy Barry    MRN: [id number] DOB: 1991/07/31  02/27/2020  [name] was observed post Covid-19 immunization for 15 minutes without incident. He was provided with Vaccine Information Sheet and instruction to access the V-Safe system.   [name] was instructed to call 911 with any severe reactions post vaccine: Marland Kitchen Difficulty breathing  . Swelling of face and throat  . A fast heartbeat  . A bad rash all over body  . Dizziness and weakness   Immunizations Administered    Name Date Dose VIS Date Route   Pfizer COVID-19 Vaccine 02/27/2020  2:48 PM 0.3 mL 12/30/2018 Intramuscular   Manufacturer: [employer], [employer]   Lot: [id number]   NDC: [address]-9

## 2020-03-02 ENCOUNTER — Ambulatory Visit (INDEPENDENT_AMBULATORY_CARE_PROVIDER_SITE_OTHER): Payer: 59 | Admitting: Medical

## 2020-03-02 ENCOUNTER — Other Ambulatory Visit: Payer: Self-pay

## 2020-03-02 ENCOUNTER — Encounter: Payer: Self-pay | Admitting: Medical

## 2020-03-02 VITALS — BP 134/84 | HR 73 | Temp 98.0°F | Ht 71.0 in | Wt 276.0 lb

## 2020-03-02 DIAGNOSIS — Z7189 Other specified counseling: Secondary | ICD-10-CM

## 2020-03-02 DIAGNOSIS — I889 Nonspecific lymphadenitis, unspecified: Secondary | ICD-10-CM | POA: Diagnosis not present

## 2020-03-02 DIAGNOSIS — Z7185 Encounter for immunization safety counseling: Secondary | ICD-10-CM

## 2020-03-02 DIAGNOSIS — R109 Unspecified abdominal pain: Secondary | ICD-10-CM

## 2020-03-02 MED ORDER — SUCRALFATE 1 GM/10ML PO SUSP: 1.0000 g | Freq: Three times a day (TID) | ORAL | 0 refills | Status: DC

## 2020-03-02 MED ORDER — PANTOPRAZOLE SODIUM 40 MG PO TBEC: 40.0000 mg | DELAYED_RELEASE_TABLET | Freq: Two times a day (BID) | ORAL | 0 refills | Status: DC

## 2020-03-02 NOTE — Progress Notes (Signed)
Subjective:  Timothy Barry is a 29 y.o. male who presents for Chief Complaint  Patient presents with  . Cyst    left armpit      Here for swelling in the left armpit.  He notes 3 days ago he got the Covid vaccine #1, [employer].  Within a day or so he started getting swelling and mild discomfort in the armpit.  He notes a history of some mild small boils in the armpits in the past but nothing like this.  He denies any breast lesions.  He denies fever, chills, body aches or fatigue.    He had a recent gastroenterology visit and has upcoming EGD and colonoscopy.  He needs refills on sucralfate.  He has questions about aspirin.  No other aggravating or relieving factors.    No other c/o.  The following portions of the patient's history were reviewed and updated as appropriate: allergies, current medications, past family history, past medical history, past social history, past surgical history and problem list.  ROS Otherwise as in subjective above  Objective: BP 134/84   Pulse 73   Temp 98 F (36.7 C)   Ht 5\' 11"  (1.803 m)   Wt 276 lb (125.2 kg)   SpO2 98%   BMI 38.49 kg/m   General appearance: alert, no distress, well developed, well nourished There are several swollen lymph nodes in the left axilla, otherwise no other lymphadenopathy, no breast lesions, no skin changes, and no lymph nodes specifically seems larger than half centimeter Mild tenderness in the left axilla    Assessment: Encounter Diagnoses  Name Primary?  . Lymphadenitis Yes  . Immunization counseling   . Abdominal pain, unspecified abdominal location      Plan: We discussed his findings and concerns.  We discussed that we have seen several people have lymph node inflammation after the Covid vaccine so this seems to be an immune reaction.  We discussed that it could take a few weeks for this to calm down.  He can use cool compress or over-the-counter ibuprofen short-term if desired.  If not too much discomfort  he can leave it alone.  Reassured.  We discussed other types of immune response such as body aches, chills, low-grade fever, fatigue.  I encouraged him to go ahead and get the second dose as planned in May, but do not be surprised if he has a similar reaction.  I refilled his medication Carafate.  He has upcoming EGD and colonoscopy.  I reviewed the recent gastroenterology note regarding abdominal pain, plan for diagnostic endoscopy.  Call or return if unusual or new symptoms not resolving within the timeframe we discussed   [name] was seen today for cyst.  Diagnoses and all orders for this visit:  Lymphadenitis  Immunization counseling  Abdominal pain, unspecified abdominal location  Other orders -     sucralfate (CARAFATE) 1 GM/10ML suspension; Take 10 mLs (1 g total) by mouth 4 (four) times daily -  with meals and at bedtime. -     pantoprazole (PROTONIX) 40 MG tablet; Take 1 tablet (40 mg total) by mouth 2 (two) times daily.    Follow up: With gastroenterology as planned

## 2020-03-02 NOTE — Patient Instructions (Addendum)
Encounter Diagnoses  Name Primary?  . Lymphadenitis Yes  . Immunization counseling      Recommendations: Your armpit finding is swollen lymph nodes.   This is called lymphadenitis.   We have seen several examples of this after the Covid vaccine.  We have seen most people's lymph nodes calm down in about 2 weeks  You can take over the counter Ibuprofen 200mg , 3 tablets twice daily for the next 5 days if desired.  This may help reduce the inflammation.  If the swelling is not very painful, you can leave it alone and do nothing  This seems to be a "immune response" to the Covid vaccine.  This may or may not happen with the second dose.  Its hard to predict.   Other vaccine reactions can include several days of body aches, chills, low grade fever, fatigue, but these symptoms usually resolve in 3-7 days.  Many vaccine including shingles and influenza vaccines can give the same response.   Don't plan to receive any other vaccine for at least the next 1 and 1/2 month, or at least 3-4 weeks after the second dose to avoid any other vaccine reaction.

## 2020-03-15 ENCOUNTER — Ambulatory Visit (INDEPENDENT_AMBULATORY_CARE_PROVIDER_SITE_OTHER): Payer: 59

## 2020-03-15 ENCOUNTER — Other Ambulatory Visit: Payer: Self-pay | Admitting: Gastroenterology

## 2020-03-15 DIAGNOSIS — Z1159 Encounter for screening for other viral diseases: Secondary | ICD-10-CM

## 2020-03-15 LAB — SARS CORONAVIRUS 2 (TAT 6-24 HRS): SARS Coronavirus 2: NEGATIVE

## 2020-03-17 ENCOUNTER — Encounter: Payer: Self-pay | Admitting: Gastroenterology

## 2020-03-17 ENCOUNTER — Encounter: Payer: 59 | Admitting: Gastroenterology

## 2020-03-17 ENCOUNTER — Other Ambulatory Visit: Payer: Self-pay

## 2020-03-17 ENCOUNTER — Ambulatory Visit (AMBULATORY_SURGERY_CENTER): Payer: 59 | Admitting: Gastroenterology

## 2020-03-17 VITALS — BP 138/89 | HR 71 | Temp 96.2°F | Resp 15 | Ht 71.0 in | Wt 281.0 lb

## 2020-03-17 DIAGNOSIS — K2950 Unspecified chronic gastritis without bleeding: Secondary | ICD-10-CM | POA: Diagnosis not present

## 2020-03-17 DIAGNOSIS — K5289 Other specified noninfective gastroenteritis and colitis: Secondary | ICD-10-CM

## 2020-03-17 DIAGNOSIS — K319 Disease of stomach and duodenum, unspecified: Secondary | ICD-10-CM | POA: Diagnosis not present

## 2020-03-17 DIAGNOSIS — K599 Functional intestinal disorder, unspecified: Secondary | ICD-10-CM | POA: Diagnosis not present

## 2020-03-17 DIAGNOSIS — R1013 Epigastric pain: Secondary | ICD-10-CM | POA: Diagnosis not present

## 2020-03-17 DIAGNOSIS — R194 Change in bowel habit: Secondary | ICD-10-CM

## 2020-03-17 MED ORDER — SODIUM CHLORIDE 0.9 % IV SOLN: 500.0000 mL | Freq: Once | INTRAVENOUS | Status: DC

## 2020-03-17 NOTE — Progress Notes (Signed)
Pt's states no medical or surgical changes since previsit or office visit. 

## 2020-03-17 NOTE — Progress Notes (Signed)
A/ox3, pleased with MAC, report to RN 

## 2020-03-17 NOTE — Patient Instructions (Addendum)
Read all handouts given to you by your recovery room nurse.  You will hear from [place] sometime with in the next two weeks regarding your lab results.  YOU HAD AN ENDOSCOPIC PROCEDURE TODAY AT THE [facility]:   Refer to the procedure report that was given to you for any specific questions about what was found during the examination.  If the procedure report does not answer your questions, please call your gastroenterologist to clarify.  If you requested that your care partner not be given the details of your procedure findings, then the procedure report has been included in a sealed envelope for you to review at your convenience later.  YOU SHOULD EXPECT: Some feelings of bloating in the abdomen. Passage of more gas than usual.  Walking can help get rid of the air that was put into your GI tract during the procedure and reduce the bloating. If you had a lower endoscopy (such as a colonoscopy or flexible sigmoidoscopy) you may notice spotting of blood in your stool or on the toilet paper. If you underwent a bowel prep for your procedure, you may not have a normal bowel movement for a few days.  Please Note:  You might notice some irritation and congestion in your nose or some drainage.  This is from the oxygen used during your procedure.  There is no need for concern and it should clear up in a day or so.  SYMPTOMS TO REPORT IMMEDIATELY:   Following lower endoscopy (colonoscopy or flexible sigmoidoscopy):  Excessive amounts of blood in the stool  Significant tenderness or worsening of abdominal pains  Swelling of the abdomen that is new, acute  Fever of 100F or higher   Following upper endoscopy (EGD)  Vomiting of blood or coffee ground material  New chest pain or pain under the shoulder blades  Painful or persistently difficult swallowing  New shortness of breath  Fever of 100F or higher  Black, tarry-looking stools  For urgent or emergent issues, a gastroenterologist can be  reached at any hour by calling [phone]. Do not use MyChart messaging for urgent concerns.    DIET:  We do recommend a small meal at first, but then you may proceed to your regular diet.  Drink plenty of fluids but you should avoid alcoholic beverages for 24 hours.  ACTIVITY:  You should plan to take it easy for the rest of today and you should NOT DRIVE or use heavy machinery until tomorrow (because of the sedation medicines used during the test).    FOLLOW UP: Our staff will call the number listed on your records 48-72 hours following your procedure to check on you and address any questions or concerns that you may have regarding the information given to you following your procedure. If we do not reach you, we will leave a message.  We will attempt to reach you two times.  During this call, we will ask if you have developed any symptoms of COVID 19. If you develop any symptoms (ie: fever, flu-like symptoms, shortness of breath, cough etc.) before then, please call [phone].  If you test positive for Covid 19 in the 2 weeks post procedure, please call and report this information to [place].    If any biopsies were taken you will be contacted by phone or by letter within the next 1-3 weeks.  Please call us at [phone] if you have not heard about the biopsies in 3 weeks.    SIGNATURES/CONFIDENTIALITY: You  and/or your care partner have signed paperwork which will be entered into your electronic medical record.  These signatures attest to the fact that that the information above on your After Visit Summary has been reviewed and is understood.  Full responsibility of the confidentiality of this discharge information lies with you and/or your care-partner.

## 2020-03-18 ENCOUNTER — Telehealth: Payer: Self-pay | Admitting: Gastroenterology

## 2020-03-18 NOTE — Telephone Encounter (Signed)
Patient called with his cousin translating. C/o epigastric and chest pain since procedures yesterday. (I cannot access reports since [employer] is functioning inconsistently and many reports have not transferred to Epic for last 2 days.)  Patient uncertain of findings or any interventions.  Review of recent ED notes and [facility] office note indicates patient was having at least EGD for this symptom complex, but cousin tells me he was not having pain before the procedure yesterday.  Pain also reportedly worse with deep breath.  I recommended he proceed immediately to the nearest emergency department.  The cousin was speaking to him and replied that he will consider it or perhaps call the office Monday when Dr. Orvan Falconer might be available.

## 2020-03-20 NOTE — Telephone Encounter (Signed)
Please call the patient to see how he is doing. Please offer a follow-up appointment with me or an APP this week. (He has seen Victorino Dike in the clinic before) Thank you.

## 2020-03-21 ENCOUNTER — Telehealth: Payer: Self-pay

## 2020-03-21 NOTE — Telephone Encounter (Signed)
Left message for pt to call back  °

## 2020-03-21 NOTE — Telephone Encounter (Signed)
Patient's cousin returned the call check schedule for appt nothing available till 04/12/20

## 2020-03-21 NOTE — Telephone Encounter (Signed)
Left message on answering machine. 

## 2020-03-21 NOTE — Telephone Encounter (Signed)
  Follow up Call-  Call back number 03/17/2020  Post procedure Call Back phone  # [phone]  Permission to leave phone message Yes     Patient questions:  Do you have a fever, pain , or abdominal swelling? No. Pain Score  0 *  Have you tolerated food without any problems? Yes.    Have you been able to return to your normal activities? Yes.    Do you have any questions about your discharge instructions: Diet   No. Medications  No. Follow up visit  No.  Do you have questions or concerns about your Care? No.  Actions: * If pain score is 4 or above: No action needed, pain <4. 1. Have you developed a fever since your procedure? no  2.   Have you had an respiratory symptoms (SOB or cough) since your procedure? no  3.   Have you tested positive for COVID 19 since your procedure no  4.   Have you had any family members/close contacts diagnosed with the COVID 19 since your procedure?  no   If yes to any of these questions please route to Laverna Peace, RN and Charlett Lango, RN

## 2020-03-22 ENCOUNTER — Other Ambulatory Visit: Payer: Self-pay

## 2020-03-22 DIAGNOSIS — R194 Change in bowel habit: Secondary | ICD-10-CM

## 2020-03-22 DIAGNOSIS — R1013 Epigastric pain: Secondary | ICD-10-CM

## 2020-03-22 NOTE — Telephone Encounter (Signed)
Pt scheduled to see Doug Sou PA tomorrow at 11am. Pt aware of appt. See result note, pt needs GI pathogen panel also, order in epic.

## 2020-03-23 ENCOUNTER — Ambulatory Visit (INDEPENDENT_AMBULATORY_CARE_PROVIDER_SITE_OTHER): Payer: 59 | Admitting: Gastroenterology

## 2020-03-23 ENCOUNTER — Other Ambulatory Visit: Payer: 59

## 2020-03-23 ENCOUNTER — Encounter: Payer: Self-pay | Admitting: Gastroenterology

## 2020-03-23 VITALS — BP 117/74 | HR 78 | Ht 71.0 in | Wt 272.5 lb

## 2020-03-23 DIAGNOSIS — K529 Noninfective gastroenteritis and colitis, unspecified: Secondary | ICD-10-CM

## 2020-03-23 DIAGNOSIS — R195 Other fecal abnormalities: Secondary | ICD-10-CM | POA: Diagnosis not present

## 2020-03-23 DIAGNOSIS — R6881 Early satiety: Secondary | ICD-10-CM

## 2020-03-23 DIAGNOSIS — R1013 Epigastric pain: Secondary | ICD-10-CM | POA: Diagnosis not present

## 2020-03-23 NOTE — Progress Notes (Signed)
Reviewed and agree with management plans. ? ?Ellia Knowlton L. Lejon Afzal, MD, MPH  ?

## 2020-03-23 NOTE — Patient Instructions (Signed)
If you are age 30 or older, your body mass index should be between 23-30. Your Body mass index is 38.01 kg/m. If this is out of the aforementioned range listed, please consider follow up with your Primary Care Provider.  If you are age 109 or younger, your body mass index should be between 19-25. Your Body mass index is 38.01 kg/m. If this is out of the aformentioned range listed, please consider follow up with your Primary Care Provider.   Your provider has requested that you go to the basement level for lab work before leaving today. Press "B" on the elevator. The lab is located at the first door on the left as you exit the elevator.

## 2020-03-23 NOTE — Progress Notes (Signed)
03/23/2020 Timothy Barry [id number] 1991-09-03   HISTORY OF PRESENT ILLNESS: This is a 29 year old male who is a patient of Dr. Orvan Falconer. Has undergone extensive evaluation over the past several months for complaints of epigastric to left upper quadrant abdominal pain that goes through to his back. Says that it all began in January.  He describes this as occurring after he eats. He says his abdomen becomes very bloated, firm, distended. Reports feeling full quickly. He was also complaining of change in bowel habits with diarrhea. Most recently he had EGD and colonoscopy by Dr. Orvan Falconer on May 14: EGD was normal.  Colonoscopy revealed some erythematous mucosa throughout the left colon extending to the transverse colon.  Biopsies as follows:   1. Surgical [P], duodenal - BENIGN SMALL BOWEL MUCOSA. - NO ACTIVE INFLAMMATION OR VILLOUS ATROPHY IDENTIFIED. 2. Surgical [P], gastric - CHRONIC INACTIVE GASTRITIS. - THERE IS NO EVIDENCE OF HELICOBACTER PYLORI, DYSPLASIA OR MALIGNANCY. - SEE COMMENT. 3. Surgical [P], distal, mid, and proximal esophagus - BENIGN SQUAMOUS MUCOSA. - THERE IS NO EVIDENCE OF SIGNIFICANT INCREASE IN EOSINOPHILS, DYSPLASIA OR MALIGNANCY. 4. Surgical [P], right colon - BENIGN COLONIC MUCOSA. - NO SIGNIFICANT INFLAMMATION OR OTHER ABNORMALITIES IDENTIFIED. 5. Surgical [P], colon, transverse - FOCAL ACTIVE COLITIS. - THERE IS NO EVIDENCE OF DYSPLASIA OR MALIGNANCY. - SEE COMMENT. 6. Surgical [P], left colon - FOCAL ACTIVE CHRONIC NON-SPECIFIC COLITIS WITH INCREASE IN EOSINOPHILS. - THERE IS NO EVIDENCE OF DYSPLASIA OR MALIGNANCY. - SEE COMMENT. 7. Surgical [P], colon, rectum - ACTIVE CHRONIC NON-SPECIFIC COLITIS WITH NON-NECROTIZING GRANULOMAS AND INCREASE IN EOSINOPHILS. - THERE IS NO EVIDENCE OF DYSPLASIA OR MALIGNANCY. - SEE COMMENT.  The comment said that there were also several ovum type structures in association with the increase of eosinophils in the colonic  biopsies, indicative of possible parasitic infection. He says that his stools have improved. Still not completely normal there was some diarrhea. Plan was for stool studies.  No features consistent with IBD.   Past Medical History:  Diagnosis Date  . Abnormal EKG 11/27/2018  . GERD (gastroesophageal reflux disease)   . HTN (hypertension)   . Palpitation 11/27/2018   Past Surgical History:  Procedure Laterality Date  . No prior surgery      reports that he quit smoking about 4 years ago. His smoking use included cigarettes. He has never used smokeless tobacco. He reports that he does not drink alcohol or use drugs. family history includes Esophageal cancer in his father; Heart disease (age of onset: 78) in his maternal grandmother; Stomach cancer in his paternal aunt and paternal grandfather. No Known Allergies    Outpatient Encounter Medications as of 03/23/2020  Medication Sig  . pantoprazole (PROTONIX) 40 MG tablet Take 1 tablet (40 mg total) by mouth 2 (two) times daily.  . sucralfate (CARAFATE) 1 GM/10ML suspension Take 10 mLs (1 g total) by mouth 4 (four) times daily -  with meals and at bedtime.   No facility-administered encounter medications on file as of 03/23/2020.     REVIEW OF SYSTEMS  : All other systems reviewed and negative except where noted in the History of Present Illness.   PHYSICAL EXAM: Ht 5\' 11"  (1.803 m)   Wt 272 lb 8 oz (123.6 kg)   BMI 38.01 kg/m  General: Well developed male in no acute distress Head: Normocephalic and atraumatic Eyes:  Sclerae anicteric, conjunctiva pink. Ears: Normal auditory acuity Lungs: Clear throughout to auscultation; no increased WOB. Heart: Regular rate and  rhythm; no M/R/G. Abdomen: Soft, non-distended.  BS present.  Non-tender. Musculoskeletal: Symmetrical with no gross deformities  Skin: No lesions on visible extremities Extremities: No edema  Neurological: Alert oriented x 4, grossly non-focal Psychological:  Alert  and cooperative. Normal mood and affect  ASSESSMENT AND PLAN: *Diarrhea: Stools are becoming more normal, but still not 100% solid, occasional diarrhea. Colonoscopy with biopsies showed focal colitis and biopsies showed increased eosinophils and ovum type structures, suggestive of possible parasitic infection. We will plan for stool studies. *Postprandial epigastric abdominal pain, upper abdominal distention, early satiety: May need gastric emptying scan. All other extensive evaluation has been unremarkable as a cause of the symptoms.  ? Biliary dysfunction, but pain is more to the left.   CC:  Tysinger, Camelia Eng, PA-C

## 2020-03-24 ENCOUNTER — Other Ambulatory Visit: Payer: 59

## 2020-03-24 DIAGNOSIS — R1013 Epigastric pain: Secondary | ICD-10-CM

## 2020-03-24 DIAGNOSIS — K529 Noninfective gastroenteritis and colitis, unspecified: Secondary | ICD-10-CM

## 2020-03-24 DIAGNOSIS — R195 Other fecal abnormalities: Secondary | ICD-10-CM

## 2020-03-26 ENCOUNTER — Ambulatory Visit: Payer: 59 | Attending: Internal Medicine

## 2020-03-26 DIAGNOSIS — Z23 Encounter for immunization: Secondary | ICD-10-CM

## 2020-03-26 LAB — GI PROFILE, STOOL, PCR

## 2020-03-26 NOTE — Progress Notes (Signed)
   Covid-19 Vaccination Clinic  Name:  Timothy Barry    MRN: [id number] DOB: [birth year]  03/26/2020  [name] was observed post Covid-19 immunization for 15 minutes without incident. He was provided with Vaccine Information Sheet and instruction to access the V-Safe system.   [name] was instructed to call 911 with any severe reactions post vaccine: Marland Kitchen Difficulty breathing  . Swelling of face and throat  . A fast heartbeat  . A bad rash all over body  . Dizziness and weakness   Immunizations Administered    Name Date Dose VIS Date Route   Pfizer COVID-19 Vaccine 03/26/2020  1:49 PM 0.3 mL 12/30/2018 Intramuscular   Manufacturer: [employer], [employer]   Lot: [id number]   NDC: [address]-6

## 2020-03-30 LAB — OVA AND PARASITE EXAMINATION
CONCENTRATE RESULT:: NONE SEEN
MICRO NUMBER:: 10500906
SPECIMEN QUALITY:: ADEQUATE
TRICHROME RESULT:: NONE SEEN

## 2020-03-31 ENCOUNTER — Telehealth: Payer: Self-pay

## 2020-03-31 NOTE — Telephone Encounter (Signed)
Stool studies are negative. Would proceed with gastric emptying scan to evaluate his left upper quadrant abdominal pain/upper GI symptoms.

## 2020-04-01 ENCOUNTER — Other Ambulatory Visit: Payer: Self-pay

## 2020-04-01 DIAGNOSIS — R1012 Left upper quadrant pain: Secondary | ICD-10-CM

## 2020-04-01 NOTE — Telephone Encounter (Signed)
Gastric emptying scan at [facility] on 04/21/20 at 730 am arrive at 715 am. Pt notified via My Chart with appointment details, pt advised to call office with any other questions.

## 2020-04-01 NOTE — Telephone Encounter (Signed)
You have been scheduled for a gastric emptying scan at [facility] on 04/21/20 at 730 am arrive at 715 am. Please arrive at least 15 minutes prior to your appointment for registration. Please make certain not to have anything to eat or drink after midnight the night before your test. Hold all stomach medications (ex: Zofran, phenergan, Reglan) 48 hours prior to your test. If you need to reschedule your appointment, please contact radiology scheduling at [phone]. _______________________________________________ A gastric-emptying study measures how long it takes for food to move through your stomach. There are several ways to measure stomach emptying. In the most common test, you eat food that contains a small amount of radioactive material. A scanner that detects the movement of the radioactive material is placed over your abdomen to monitor the rate at which food leaves your stomach. This test normally takes about 4 hours to complete. _______________________________________________

## 2020-04-11 ENCOUNTER — Other Ambulatory Visit: Payer: Self-pay

## 2020-04-11 ENCOUNTER — Encounter: Payer: Self-pay | Admitting: Medical

## 2020-04-11 ENCOUNTER — Ambulatory Visit (INDEPENDENT_AMBULATORY_CARE_PROVIDER_SITE_OTHER): Payer: 59 | Admitting: Medical

## 2020-04-11 VITALS — BP 122/78 | HR 78 | Ht 71.0 in | Wt 274.4 lb

## 2020-04-11 DIAGNOSIS — E669 Obesity, unspecified: Secondary | ICD-10-CM | POA: Diagnosis not present

## 2020-04-11 DIAGNOSIS — L0292 Furuncle, unspecified: Secondary | ICD-10-CM | POA: Insufficient documentation

## 2020-04-11 DIAGNOSIS — R0789 Other chest pain: Secondary | ICD-10-CM

## 2020-04-11 DIAGNOSIS — R7301 Impaired fasting glucose: Secondary | ICD-10-CM

## 2020-04-11 DIAGNOSIS — R002 Palpitations: Secondary | ICD-10-CM

## 2020-04-11 MED ORDER — MUPIROCIN 2 % EX OINT: 1.0000 "application " | TOPICAL_OINTMENT | Freq: Three times a day (TID) | CUTANEOUS | 0 refills | Status: DC

## 2020-04-11 MED ORDER — SULFAMETHOXAZOLE-TRIMETHOPRIM 800-160 MG PO TABS
1.0000 | ORAL_TABLET | Freq: Every day | ORAL | 0 refills | Status: DC
Start: 2020-04-11 — End: 2020-06-06

## 2020-04-11 NOTE — Progress Notes (Signed)
Subjective: Chief Complaint  Patient presents with  . Cyst    lower abdomen  . Chest Pain    middle when moving     Here for several concerns  He notes some skin concerns in lower abdomen x about a year, comes and goes.  Gets hard raised tender red bumps.  He notes some chest pains when he moves.   Worse if turning body side to side or lifting stuff with arms.   No arm pain.  No associated SOB, dizziness, with chest pain but does get palpitations with activity.   He is not exercising.     Wants labs to recheck screen for diabetes.   He does note some polyuria, but no polydipsia, no blurred vision.     If raising left arm for a few minutes, gets numb in hand.   Can get numb in feet if squatting for over 45 seconds.   Past Medical History:  Diagnosis Date  . Abnormal EKG 11/27/2018  . GERD (gastroesophageal reflux disease)   . HTN (hypertension)   . Palpitation 11/27/2018   Current Outpatient Medications on File Prior to Visit  Medication Sig Dispense Refill  . pantoprazole (PROTONIX) 40 MG tablet Take 1 tablet (40 mg total) by mouth 2 (two) times daily. 60 tablet 0  . sucralfate (CARAFATE) 1 GM/10ML suspension Take 10 mLs (1 g total) by mouth 4 (four) times daily -  with meals and at bedtime. 420 mL 0   No current facility-administered medications on file prior to visit.   ROS as in subjective   Objective: BP 122/78   Pulse 78   Ht 5\' 11"  (1.803 m)   Wt 274 lb 6.4 oz (124.5 kg)   SpO2 97%   BMI 38.27 kg/m   Gen: wd, wn, nad, overweight male Skin lower abdomen above pubic region with few scattered nodular lesions, 1 cm or less diameter, slightly erythematous suggestive of furuncles, no drainage, no warmth, no fluctuance, otherwise unremarkable Chest wall nontender, normal I:E Lungs clear Heart rrr, normal s1, s2, no murmurs Pulses 2+ UE and LE Ext: no edema    Assessment: Encounter Diagnoses  Name Primary?  . Palpitations Yes  . Chest discomfort   . Furuncle    . Obesity, unspecified classification, unspecified obesity type, unspecified whether serious comorbidity present   . Impaired fasting blood sugar       Plan: We discussed concerns, recommendations as below   Skin lesions: The skin lesions in your lower abdomen are called furuncles or boils.  I recommend you antibiotic Bactrim daily for the next 30 days, after the area clean with soap and water, and when you have a flareup you can use hot compress for 20 minutes at a time  You can also use mupirocin ointment topically to the lesions for up to 2 weeks at a time  The skin lesions are related to being overweight and in skin folds   The primary treatment is weight loss   Elevated pulse, palpitations At this point you have seen the heart doctor and they have found nothing seriously wrong.  I think some of this is related to being out of shape will become deconditioned  I recommend you start walking for exercise such as start walking 20 minutes daily and then every day try to add on 1 more minute.  Continue exercising daily with walking and gradually increase your exercise tolerance to 45 to 60 minutes daily  When you get to the point he  can walk for 60 minutes briskly then start trying some light jogging or bicycling or other exercise that is more strenuous gradually   Prediabetes-we will recheck the hemoglobin A1c screening test today.  Counseled on diet, exercise.     Regina was seen today for cyst and chest pain.  Diagnoses and all orders for this visit:  Palpitations  Chest discomfort  Furuncle  Obesity, unspecified classification, unspecified obesity type, unspecified whether serious comorbidity present -     Hemoglobin A1c  Impaired fasting blood sugar -     Hemoglobin A1c  Other orders -     sulfamethoxazole-trimethoprim (BACTRIM DS) 800-160 MG tablet; Take 1 tablet by mouth daily. -     mupirocin ointment (BACTROBAN) 2 %; Apply 1 application topically 3 (three)  times daily.  6 weeks

## 2020-04-11 NOTE — Patient Instructions (Signed)
Encounter Diagnoses  Name Primary?  . Palpitations Yes  . Chest discomfort   . Furuncle   . Obesity, unspecified classification, unspecified obesity type, unspecified whether serious comorbidity present   . Impaired fasting blood sugar     Recommendations  Skin lesions: The skin lesions in your lower abdomen are called furuncles or boils.  I recommend you antibiotic Bactrim daily for the next 30 days, after the area clean with soap and water, and when you have a flareup you can use hot compress for 20 minutes at a time  You can also use mupirocin ointment topically to the lesions for up to 2 weeks at a time  The skin lesions are related to being overweight and in skin folds   The primary treatment is weight loss   Elevated pulse, palpitations At this point you have seen the heart doctor and they have found nothing seriously wrong.  I think some of this is related to being out of shape will become deconditioned  I recommend you start walking for exercise such as start walking 20 minutes daily and then every day try to add on 1 more minute.  Continue exercising daily with walking and gradually increase your exercise tolerance to 45 to 60 minutes daily  When you get to the point he can walk for 60 minutes briskly then start trying some light jogging or bicycling or other exercise that is more strenuous gradually    Prediabetes-we will recheck the hemoglobin A1c screening test today     Dietary recommendations:  Breakfast You may eat 1 of the following  Smoothie with Almond milk, handful of kale or spinach, and 1-2 fruit servings of your choice such as berries or 1/2 banana  Whole grain slice of toast and thin layer of low sugar jam or small amount of honey  Whole grain slice of toast and avocado spread  1/2 cup of steel cut oats (oatmeal)   Mid-morning snack 1 fruit serving such as one of the following:  medium-sized apple  medium-sized orange,  Tangerine  1/2  banana   3/4 cup of fresh berries or frozen berries  A protein source such as one of the following:  8 almonds   small handful of walnuts or other nuts  Hummus and vegetable such as carrots   Lunch A protein source such as 1 of the following: . 1 serving of beans such as black beans, pinto beans, green beans, or edamame (soy beans) . Veggie burger  . Non breaded fish such as salmon or tuna, either baked, grilled, or broiled Vegetable - Half of your plate should be a non-starchy vegetables!  So avoid white potatoes and corn.  Otherwise, eat a large portion of vegetables. . Avocado, cucumber, tomato, carrots, greens, lettuce, squash, okra, etc.  . Vegetables can include salad with olive oil/vinaigrette dressing Grains such as 1/2 cup of brown rice, quinoa, barley or other whole grain or 1 or 2 slices of whole grain bread   Mid-afternoon snack 1 fruit serving such as one of the following:  medium-sized apple  medium-sized orange,  Tangerine  1/2 banana   3/4 cup of fresh berries or frozen berries  A protein source such as one of the following:  8 almonds   small handful of walnuts or other nuts  Hummus and vegetable such as carrots   Dinner A protein source such as 1 of the following: . 1 serving of beans such as black beans, pinto beans, green beans, or  edamame (soy beans) . Veggie burger  . Non breaded fish such as salmon or tuna, either baked, grilled, or broiled Vegetable - Half of your plate should be a non-starchy vegetables!  So avoid white potatoes and corn.  Otherwise, eat a large portion of vegetables. . Avocado, cucumber, tomato, carrots, greens, lettuce, squash, okra, etc.  . Vegetables can include salad with olive oil/vinaigrette dressing Grains such as 1/2 cup of brown rice, quinoa, barley or other whole grain or 1 or 2 slices of whole grain bread   Beverages: Water Unsweet tea Home made juice with a juicer without sugar added other than small bit  of honey or agave nectar Water with sugar free flavor such as [place]   AVOID.... For the time being I want you to cut out the following items completely: . Soda, sweet tea, juice, beer or wine or alcohol . Sweets such as cake, candy, pies, chips, cookies, chocolate

## 2020-04-12 LAB — HEMOGLOBIN A1C
Est. average glucose Bld gHb Est-mCnc: 120 mg/dL
Hgb A1c MFr Bld: 5.8 % — ABNORMAL HIGH (ref 4.8–5.6)

## 2020-04-13 ENCOUNTER — Encounter (HOSPITAL_COMMUNITY)
Admission: RE | Admit: 2020-04-13 | Discharge: 2020-04-13 | Disposition: A | Discharge status: Home / Self Care | Payer: 59 | Source: Ambulatory Visit | Attending: Gastroenterology | Admitting: Gastroenterology

## 2020-04-13 ENCOUNTER — Other Ambulatory Visit: Payer: Self-pay

## 2020-04-13 DIAGNOSIS — R1012 Left upper quadrant pain: Secondary | ICD-10-CM | POA: Diagnosis present

## 2020-04-13 MED ORDER — TECHNETIUM TC 99M SULFUR COLLOID
2.0000 | Freq: Once | INTRAVENOUS | Status: AC | PRN
  Administered 2020-04-13: 2 via INTRAVENOUS

## 2020-04-15 ENCOUNTER — Ambulatory Visit (INDEPENDENT_AMBULATORY_CARE_PROVIDER_SITE_OTHER): Payer: 59 | Admitting: Nurse Practitioner

## 2020-04-15 ENCOUNTER — Encounter: Payer: Self-pay | Admitting: Nurse Practitioner

## 2020-04-15 VITALS — BP 116/80 | HR 80 | Ht 71.0 in | Wt 272.1 lb

## 2020-04-15 DIAGNOSIS — K529 Noninfective gastroenteritis and colitis, unspecified: Secondary | ICD-10-CM | POA: Diagnosis not present

## 2020-04-15 DIAGNOSIS — M549 Dorsalgia, unspecified: Secondary | ICD-10-CM

## 2020-04-15 NOTE — Progress Notes (Signed)
IMPRESSION and PLAN:     # Several recent GI symptoms --Early satiety has resolved.  Gastric emptying study was normal --Upper abdominal pain has resolved.  Ultrasound and CT scan unrevealing  --Has chest discomfort but only associated with bending and twisting so does not sound GI in nature.  --Now complains of postprandial mid back pain,  ? Etiology unclear.  --Continue Carafate for now but try taking before meals --Continue daily PPI for now  -Already has a follow up scheduled with Dr. Orvan Falconer in a month  # Coliits, unclear etiology --Focal active,  Chronic,  nonspecific colitis with increase in eosinophils on left colon biopsy.  Active , chronic nonspecific colitis with nonnecrotizing granulomas and increase in eosinophils on rectal biopsy.  Stool test for infectious etiology was negative but were performed at later date so findings could have still been infectious.  --Diarrhea has resolved so I don't know that he needs additional workup.  --Has follow up with Dr. Orvan Falconer in a month      HPI:    Primary GI: Tressia Danas, MD   Chief complaint : follow up   29 year old male who saw Hyacinth Meeker, [place] in April of this year for abdominal pain, she saw Doug Sou mid May of this year for loose stool.  I am seeing him today for the first time   EGD and colonoscopy by Dr. Orvan Falconer May 14.  EGD was normal.  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  Left colon biopsies compatible with focal active chronic nonspecific colitis with increase in eosinophils . Transverse colon biopsies compatible with focal active colitis.  Right colon biopsies normal.  At his last visit with Shanda Bumps on 03/23/2020 patient's bowel movements had improved.  Comments on colon biopsies raised concern for possible parasitic infection so plan was for stool studies.   Additionally he was having some postprandial epigastric abdominal pain and distention with early satiety so there was mention of obtaining a gastric emptying scan at some point.  This was actually arranged at a later date through telephone calls.  He was scheduled to have the study done on 04/21/2020 but somehow it got moved up and was done on 6/9. The study was normal.   Bowel movements have normalized. Early satiety has improved. Appetite is okay.  He has a little pain in chest but when bending / twisting. Hot sauces cause burning in chest. No other foods cause the burning. Over the last 3 months he gets pain in mid back one hour after eating. Episodes occur daily after each meal. Sometimes feels nausea when wakes up. Doesn't eat much fried food. RUQ normal. CT scan in January 2021.   Review of systems:     No chest pain, no SOB, no fevers, no urinary sx   Past Medical History:  Diagnosis Date  . Abnormal EKG 11/27/2018  . GERD (gastroesophageal reflux disease)   . HTN (hypertension)   . Palpitation 11/27/2018    Patient's surgical history, family medical history, social history, medications and allergies were all reviewed in Epic   Creatinine clearance cannot be calculated (Patient's most recent lab result is older than the maximum 21 days allowed.)  Current Outpatient Medications  Medication Sig Dispense Refill  . pantoprazole (PROTONIX) 40 MG tablet Take 1 tablet (40 mg total) by mouth 2 (two) times daily. 60 tablet 0  .  sucralfate (CARAFATE) 1 GM/10ML suspension Take 10 mLs (1 g total) by mouth 4 (four) times daily -  with meals and at bedtime. 420 mL 0  . mupirocin ointment (BACTROBAN) 2 % Apply 1 application topically 3 (three) times daily. (Patient not taking: Reported on 04/15/2020) 22 g 0  . sulfamethoxazole-trimethoprim (BACTRIM DS) 800-160 MG tablet Take 1 tablet by mouth daily. (Patient not taking: Reported on 04/15/2020) 30 tablet 0   No current facility-administered medications  for this visit.    Physical Exam:     BP 116/80 (BP Location: Left Arm, Patient Position: Sitting, Cuff Size: Normal)   Pulse 80   Ht 5\' 11"  (1.803 m)   Wt 272 lb 2 oz (123.4 kg)   BMI 37.95 kg/m   GENERAL:  Pleasant male in NAD PSYCH: : Cooperative, normal affect CARDIAC:  RRR PULM: Normal respiratory effort, lungs CTA bilaterally, no wheezing ABDOMEN:  Nondistended, soft, nontender. No obvious masses, no hepatomegaly,  normal bowel sounds SKIN:  turgor, no lesions seen Musculoskeletal:  Normal muscle tone, normal strength NEURO: Alert and oriented x 3, no focal neurologic deficits   Tye Savoy , NP 04/15/2020, 2:16 PM

## 2020-04-15 NOTE — Patient Instructions (Addendum)
If you are age 29 or older, your body mass index should be between 23-30. Your Body mass index is 37.95 kg/m. If this is out of the aforementioned range listed, please consider follow up with your Primary Care Provider.  If you are age 95 or younger, your body mass index should be between 19-25. Your Body mass index is 37.95 kg/m. If this is out of the aformentioned range listed, please consider follow up with your Primary Care Provider.   Carafate 1 gm ( ) 30 minutes before breakfast, lunch and dinner.   Continue Pantoprazole 40 mg daily.  Keep follow up appointment with Dr. Orvan Falconer.

## 2020-04-21 ENCOUNTER — Encounter (HOSPITAL_COMMUNITY): Payer: 59

## 2020-05-17 ENCOUNTER — Ambulatory Visit: Payer: 59 | Admitting: Gastroenterology

## 2020-05-23 ENCOUNTER — Ambulatory Visit: Payer: 59 | Admitting: Medical

## 2020-06-06 ENCOUNTER — Encounter: Payer: Self-pay | Admitting: Medical

## 2020-06-06 ENCOUNTER — Ambulatory Visit (INDEPENDENT_AMBULATORY_CARE_PROVIDER_SITE_OTHER): Payer: 59 | Admitting: Medical

## 2020-06-06 ENCOUNTER — Other Ambulatory Visit: Payer: Self-pay

## 2020-06-06 VITALS — BP 140/100 | HR 78 | Temp 98.6°F | Ht 71.0 in | Wt 270.8 lb

## 2020-06-06 DIAGNOSIS — R5381 Other malaise: Secondary | ICD-10-CM | POA: Insufficient documentation

## 2020-06-06 DIAGNOSIS — R002 Palpitations: Secondary | ICD-10-CM | POA: Diagnosis not present

## 2020-06-06 DIAGNOSIS — Z6837 Body mass index (BMI) 37.0-37.9, adult: Secondary | ICD-10-CM

## 2020-06-06 DIAGNOSIS — R110 Nausea: Secondary | ICD-10-CM

## 2020-06-06 DIAGNOSIS — R531 Weakness: Secondary | ICD-10-CM | POA: Insufficient documentation

## 2020-06-06 DIAGNOSIS — R202 Paresthesia of skin: Secondary | ICD-10-CM

## 2020-06-06 DIAGNOSIS — R5383 Other fatigue: Secondary | ICD-10-CM | POA: Insufficient documentation

## 2020-06-06 DIAGNOSIS — R42 Dizziness and giddiness: Secondary | ICD-10-CM | POA: Diagnosis not present

## 2020-06-06 LAB — POCT URINALYSIS DIP (PROADVANTAGE DEVICE)
Bilirubin, UA: NEGATIVE
Glucose, UA: NEGATIVE mg/dL
Ketones, POC UA: NEGATIVE mg/dL
Leukocytes, UA: NEGATIVE
Nitrite, UA: NEGATIVE
Protein Ur, POC: NEGATIVE mg/dL
Specific Gravity, Urine: 1.03
Urobilinogen, Ur: NEGATIVE
pH, UA: 6 (ref 5.0–8.0)

## 2020-06-06 NOTE — Patient Instructions (Addendum)
  Recommendations  I believe some of your symptoms represent deconditioning or being out of shape  I recommend you start working on a regular exercise program such as walking or jogging or bicycling 30 minutes daily  Once you can do this without short of breath or fatigue for the next 3 weeks or so then bump up to 45 minutes at a time  As your stamina and exercise tolerance improves I believe your symptoms will improve  I recommend you work on losing weight through low-fat healthy diet, decreasing calories, eating a good variety of fruits and vegetables  You have symptoms that suggest possible sleep apnea  This can be related to being overweight  I recommend sleeping on your belly or side and not your back  We would likely send you for sleep study to check for sleep apnea which could be causing several of the symptoms like palpitations and numbness  I recommend taking a daily multivitamin

## 2020-06-06 NOTE — Progress Notes (Signed)
Subjective:  Timothy Barry is a 29 y.o. male who presents for Chief Complaint  Patient presents with  . Fatigue    x5 days   . Nausea    x5 days      Here for around 5 day hx/o weakness, sometimes dizzy, some nausea, fatigued.   Has had some pains in mid pack.  Gets some pains in upper abdomen/stomach.  No fever, no vomiting, no diarrhea, but some looser stool.   No chills.  No body aches.  No cough, no runny nose, no sore throat.  Sometimes lungs feel heavy.  No other aggravating or relieving factors.  No recent covid test.   Has had covid vaccine.  Has felt warm under feet.   Has felt some sharp pains in feet.   No other c/o.  The following portions of the patient's history were reviewed and updated as appropriate: allergies, current medications, past family history, past medical history, past social history, past surgical history and problem list.  Past Medical History:  Diagnosis Date  . Abnormal EKG 11/27/2018  . GERD (gastroesophageal reflux disease)   . HTN (hypertension)   . Palpitation 11/27/2018   Current Outpatient Medications on File Prior to Visit  Medication Sig Dispense Refill  . pantoprazole (PROTONIX) 40 MG tablet Take 1 tablet (40 mg total) by mouth 2 (two) times daily. (Patient not taking: Reported on 06/06/2020) 60 tablet 0  . sucralfate (CARAFATE) 1 GM/10ML suspension Take 10 mLs (1 g total) by mouth 4 (four) times daily -  with meals and at bedtime. (Patient not taking: Reported on 06/06/2020) 420 mL 0   No current facility-administered medications on file prior to visit.   Past Surgical History:  Procedure Laterality Date  . No prior surgery      ROS Otherwise as in subjective above   Objective: BP (!) 140/100 (BP Location: Right Arm, Patient Position: Standing, Cuff Size: Large)   Pulse 78   Temp 98.6 F (37 C)   Ht 5\' 11"  (1.803 m)   Wt (!) 270 lb 12.8 oz (122.8 kg)   BMI 37.77 kg/m   BP Readings from Last 3 Encounters:  06/06/20 (!) 140/100   04/15/20 116/80  04/11/20 122/78    Wt Readings from Last 3 Encounters:  06/06/20 (!) 270 lb 12.8 oz (122.8 kg)  04/15/20 272 lb 2 oz (123.4 kg)  04/11/20 274 lb 6.4 oz (124.5 kg)   General appearance: alert, no distress, well developed, well nourished HEENT: normocephalic, sclerae anicteric, conjunctiva pink and moist, TMs pearly, nares patent, no discharge or erythema, pharynx normal Oral cavity: somewhat dry MM, no lesions Neck: supple, no lymphadenopathy, no thyromegaly, no masses, no JVD Heart: RRR, normal S1, S2, no murmurs Lungs: CTA bilaterally, no wheezes, rhonchi, or rales Abdomen: +bs, soft, mildly generalized tenderness of abdomen, non distended, no masses, no hepatomegaly, no splenomegaly Pulses: 2+ radial pulses, 2+ pedal pulses, normal cap refill Ext: no edema Neuro: normal strength, sensation, DTRs, no obvious weakness or asymmetry, CN2-12 intact    Assessment: Encounter Diagnoses  Name Primary?  . Weakness Yes  . Dizziness   . Nausea   . Palpitation   . Paresthesia   . Fatigue, unspecified type   . Physical deconditioning   . BMI 37.0-37.9, adult      Plan: He has a variety of generalized symptoms today.  In recent months he has been seen multiple times for multiple symptoms  I reviewed the February 2021 cardiology note.  They have  been following him for palpitations.  He had an echocardiogram back in January showing normal LVEF, EF 60-65%, mild left atrial enlargement, occasional PAC and PVC.  He also had an EKG showing narrow complex tachycardia with a rate around 160.  Cardiology question anxiety versus dehydration.  He had stool studies back in May for ova and parasites and comprehensive stool profile that were all negative  His last basic metabolic and CBC were in basically normal in March other than some elevated glucose.  He has maintained a borderline hemoglobin A1c  We discussed his eval by cardiology.   We discussed hydration, rest.      Patient Instructions   Recommendations  I believe some of your symptoms represent deconditioning or being out of shape  I recommend you start working on a regular exercise program such as walking or jogging or bicycling 30 minutes daily  Once you can do this without short of breath or fatigue for the next 3 weeks or so then bump up to 45 minutes at a time  As your stamina and exercise tolerance improves I believe your symptoms will improve  I recommend you work on losing weight through low-fat healthy diet, decreasing calories, eating a good variety of fruits and vegetables  You have symptoms that suggest possible sleep apnea  This can be related to being overweight  I recommend sleeping on your belly or side and not your back  We would likely send you for sleep study to check for sleep apnea which could be causing several of the symptoms like palpitations and numbness  I recommend taking a daily multivitamin      [place] was seen today for fatigue and nausea.  Diagnoses and all orders for this visit:  Weakness -     Orthostatic vital signs -     POCT Urinalysis DIP (Proadvantage Device) -     Lipase  Dizziness -     Orthostatic vital signs -     POCT Urinalysis DIP (Proadvantage Device)  Nausea -     Orthostatic vital signs -     POCT Urinalysis DIP (Proadvantage Device) -     Lipase  Palpitation -     Orthostatic vital signs -     POCT Urinalysis DIP (Proadvantage Device)  Paresthesia -     Vitamin B12 -     Comprehensive metabolic panel -     Lipase  Fatigue, unspecified type -     Vitamin B12 -     Comprehensive metabolic panel -     Lipase  Physical deconditioning  BMI 37.0-37.9, adult    Follow up: pending labs

## 2020-06-07 LAB — COMPREHENSIVE METABOLIC PANEL
ALT: 22 IU/L (ref 0–44)
AST: 24 IU/L (ref 0–40)
Albumin/Globulin Ratio: 1.5 (ref 1.2–2.2)
Albumin: 4.4 g/dL (ref 4.1–5.2)
Alkaline Phosphatase: 80 IU/L (ref 48–121)
BUN/Creatinine Ratio: 14 (ref 9–20)
BUN: 10 mg/dL (ref 6–20)
Bilirubin Total: 0.4 mg/dL (ref 0.0–1.2)
CO2: 22 mmol/L (ref 20–29)
Calcium: 9.6 mg/dL (ref 8.7–10.2)
Chloride: 98 mmol/L (ref 96–106)
Creatinine, Ser: 0.71 mg/dL — ABNORMAL LOW (ref 0.76–1.27)
GFR calc Af Amer: 147 mL/min/{1.73_m2} (ref >59)
GFR calc non Af Amer: 127 mL/min/{1.73_m2} (ref >59)
Globulin, Total: 3 g/dL (ref 1.5–4.5)
Glucose: 93 mg/dL (ref 65–99)
Potassium: 4.7 mmol/L (ref 3.5–5.2)
Sodium: 140 mmol/L (ref 134–144)
Total Protein: 7.4 g/dL (ref 6.0–8.5)

## 2020-06-07 LAB — LIPASE: Lipase: 38 U/L (ref 13–78)

## 2020-06-07 LAB — VITAMIN B12: Vitamin B-12: 403 pg/mL (ref 232–1245)

## 2020-06-27 ENCOUNTER — Other Ambulatory Visit: Payer: Self-pay

## 2020-06-27 ENCOUNTER — Encounter: Payer: Self-pay | Admitting: Medical

## 2020-06-27 ENCOUNTER — Ambulatory Visit (INDEPENDENT_AMBULATORY_CARE_PROVIDER_SITE_OTHER): Payer: 59 | Admitting: Medical

## 2020-06-27 VITALS — BP 130/84 | HR 92 | Ht 71.0 in | Wt 268.4 lb

## 2020-06-27 DIAGNOSIS — Z6837 Body mass index (BMI) 37.0-37.9, adult: Secondary | ICD-10-CM

## 2020-06-27 DIAGNOSIS — R002 Palpitations: Secondary | ICD-10-CM

## 2020-06-27 DIAGNOSIS — R400 Somnolence: Secondary | ICD-10-CM

## 2020-06-27 DIAGNOSIS — G8929 Other chronic pain: Secondary | ICD-10-CM | POA: Diagnosis not present

## 2020-06-27 DIAGNOSIS — K219 Gastro-esophageal reflux disease without esophagitis: Secondary | ICD-10-CM | POA: Diagnosis not present

## 2020-06-27 DIAGNOSIS — R030 Elevated blood-pressure reading, without diagnosis of hypertension: Secondary | ICD-10-CM

## 2020-06-27 DIAGNOSIS — M545 Low back pain: Secondary | ICD-10-CM

## 2020-06-27 DIAGNOSIS — R531 Weakness: Secondary | ICD-10-CM | POA: Diagnosis not present

## 2020-06-27 DIAGNOSIS — R5383 Other fatigue: Secondary | ICD-10-CM

## 2020-06-27 DIAGNOSIS — R202 Paresthesia of skin: Secondary | ICD-10-CM

## 2020-06-27 DIAGNOSIS — R3129 Other microscopic hematuria: Secondary | ICD-10-CM

## 2020-06-27 LAB — POCT URINALYSIS DIP (PROADVANTAGE DEVICE)
Glucose, UA: NEGATIVE mg/dL
Ketones, POC UA: NEGATIVE mg/dL
Leukocytes, UA: NEGATIVE
Nitrite, UA: NEGATIVE
Protein Ur, POC: 30 mg/dL — AB
Specific Gravity, Urine: 1.025
Urobilinogen, Ur: NEGATIVE
pH, UA: 6 (ref 5.0–8.0)

## 2020-06-27 LAB — CBC
Hematocrit: 46.8 % (ref 37.5–51.0)
Hemoglobin: 15.3 g/dL (ref 13.0–17.7)
MCH: 27.2 pg (ref 26.6–33.0)
MCHC: 32.7 g/dL (ref 31.5–35.7)
MCV: 83 fL (ref 79–97)
Platelets: 402 10*3/uL (ref 150–450)
RBC: 5.63 x10E6/uL (ref 4.14–5.80)
RDW: 13.1 % (ref 11.6–15.4)
WBC: 5 10*3/uL (ref 3.4–10.8)

## 2020-06-27 NOTE — Progress Notes (Signed)
Na

## 2020-06-27 NOTE — Progress Notes (Signed)
Subjective:  Timothy Barry is a 29 y.o. male who presents for Chief Complaint  Patient presents with  . Flank Pain    bilateral sides     Here today for multiple concerns.  He think something is wrong with his kidneys.  He has pain in bilateral low back.  No injury no trauma.  He is not really exercising or doing any type of calisthenics regularly.  He denies blood in the urine, no blood in the stool, no burning with urination, no discharge.  He is not sexually active.  No concern for STD.  He notes a weird sound in his stomach at times he can hear out loud.  He notes some irritation of the throat and burping at times, upper belly discomfort at times.  He is not currently taking pantoprazole that was prescribed prior  His legs feel weak at times, no numbness or tingling in the legs.  He notes that if he sleeps on one of his arms versus the other the arm will go numb within about 20 minutes.  He went into [employer] recently and check blood pressure and pulse several times in a row.  He got some elevated pulse and blood pressure readings as high as 155/90 blood pressure, and pulse as high as 110.  He feels weak sometimes.  Sometimes he feels so weak in his legs that he feels like he has been running for 2 hours.  No other aggravating or relieving factors.    No other c/o.  Past Medical History:  Diagnosis Date  . Abnormal EKG 11/27/2018  . GERD (gastroesophageal reflux disease)   . HTN (hypertension)   . Palpitation 11/27/2018   Family History  Problem Relation Age of Onset  . Esophageal cancer Father   . Heart disease Maternal Grandmother 29       MI  . Stomach cancer Paternal Grandfather   . Stomach cancer Paternal Aunt      The following portions of the patient's history were reviewed and updated as appropriate: allergies, current medications, past family history, past medical history, past social history, past surgical history and problem list.  ROS Otherwise as in  subjective above    Objective: BP 130/84   Pulse 92   Ht 5\' 11"  (1.803 m)   Wt 268 lb 6.4 oz (121.7 kg)   SpO2 97%   BMI 37.43 kg/m   General appearance: alert, no distress, well developed, well nourished Neck: supple, no lymphadenopathy, no thyromegaly, no masses, no JVD, no bruits Heart: RRR, normal S1, S2, no murmurs Lungs: CTA bilaterally, no wheezes, rhonchi, or rales Back nontender, relatively normal range of motion Abdomen: +bs, soft, non tender, non distended, no masses, no hepatomegaly, no splenomegaly Pulses: 2+ radial pulses, 2+ pedal pulses, normal cap refill Ext: no edema   Assessment: Encounter Diagnoses  Name Primary?  . Chronic bilateral low back pain without sciatica Yes  . Gastroesophageal reflux disease, unspecified whether esophagitis present   . Paresthesia of arm   . Weakness   . Daytime somnolence   . Fatigue, unspecified type   . Microscopic hematuria   . Palpitation   . Elevated blood-pressure reading without diagnosis of hypertension   . BMI 37.0-37.9, adult      Plan: Chronic low back pain-discussed the importance of stretching and regular exercise.  He is not currently exercising.  It would be helpful to get him in with a trainer or physical therapy or intro level exercise routine at the [facility] for  example.  He seems to need some guidance on getting going with stretching and exercise.  I reassured him that his kidneys seem to be fine based on the recent labs in August 2 and based on prior scan earlier this year  GERD-advised he restart pantoprazole which he has at home.  Avoid GERD triggers  My personal readings of his blood pressure is fine.  We discussed that blood pressure may fluctuate throughout the day but overall his blood pressure readings here at this office tend to be normal.  I believe he is getting himself worked up about things, causing some stress and anxiety.  Given his obesity, fatigue, weakness complaints, palpitations elevated  pressures etc.-refer to sleep study to rule out sleep apnea  Weakness-I reviewed numerous lab results in the chart from this past several months.  I will check a CK lab today just to be thorough.  He actually requested to have a whole panel labs done again which I advised was not necessary.  He requested an inpatient evaluation to find out what is wrong with him.  I advised that he has had numerous lab work, numerous other evaluation including consults with cardiology and gastroenterology in recent months, CT scans and other.  I believe some of his issues are anxiety related.  He notes history of being on medicine short-term to help with mood.  Pending labs I may put him back on a medicine to help with anxiety.  Discussed counseling.    [place] was seen today for flank pain.  Diagnoses and all orders for this visit:  Chronic bilateral low back pain without sciatica -     CBC -     POCT Urinalysis DIP (Proadvantage Device)  Gastroesophageal reflux disease, unspecified whether esophagitis present  Paresthesia of arm -     CK -     CBC  Weakness -     CK  Daytime somnolence  Fatigue, unspecified type  Microscopic hematuria  Palpitation  Elevated blood-pressure reading without diagnosis of hypertension  BMI 37.0-37.9, adult    Follow up: pending labs, sleep study

## 2020-06-28 ENCOUNTER — Other Ambulatory Visit: Payer: Self-pay | Admitting: Medical

## 2020-06-28 LAB — CK: Total CK: 81 U/L (ref 49–439)

## 2020-06-28 MED ORDER — BUPROPION HCL ER (XL) 150 MG PO TB24
150.0000 mg | ORAL_TABLET | ORAL | 2 refills | Status: DC
Start: 2020-06-28 — End: 2020-07-05

## 2020-06-28 MED ORDER — PANTOPRAZOLE SODIUM 40 MG PO TBEC: 40.0000 mg | DELAYED_RELEASE_TABLET | Freq: Every day | ORAL | 2 refills | Status: DC

## 2020-06-28 NOTE — Progress Notes (Signed)
Patient was referred to [facility]

## 2020-06-29 ENCOUNTER — Encounter (HOSPITAL_COMMUNITY): Payer: Self-pay | Admitting: Emergency Medicine

## 2020-06-29 ENCOUNTER — Emergency Department (HOSPITAL_COMMUNITY): Payer: 59

## 2020-06-29 ENCOUNTER — Emergency Department (HOSPITAL_COMMUNITY)
Admission: EM | Admit: 2020-06-29 | Discharge: 2020-06-29 | Disposition: A | Discharge status: Home / Self Care | Payer: 59 | Attending: Emergency Medicine | Admitting: Emergency Medicine

## 2020-06-29 ENCOUNTER — Other Ambulatory Visit: Payer: Self-pay

## 2020-06-29 DIAGNOSIS — R5383 Other fatigue: Secondary | ICD-10-CM | POA: Insufficient documentation

## 2020-06-29 DIAGNOSIS — R0789 Other chest pain: Secondary | ICD-10-CM | POA: Diagnosis not present

## 2020-06-29 DIAGNOSIS — Z87891 Personal history of nicotine dependence: Secondary | ICD-10-CM | POA: Diagnosis not present

## 2020-06-29 DIAGNOSIS — R531 Weakness: Secondary | ICD-10-CM | POA: Insufficient documentation

## 2020-06-29 DIAGNOSIS — I10 Essential (primary) hypertension: Secondary | ICD-10-CM | POA: Diagnosis not present

## 2020-06-29 DIAGNOSIS — R251 Tremor, unspecified: Secondary | ICD-10-CM | POA: Insufficient documentation

## 2020-06-29 DIAGNOSIS — Z20822 Contact with and (suspected) exposure to covid-19: Secondary | ICD-10-CM | POA: Insufficient documentation

## 2020-06-29 DIAGNOSIS — Z9189 Other specified personal risk factors, not elsewhere classified: Secondary | ICD-10-CM

## 2020-06-29 DIAGNOSIS — R002 Palpitations: Secondary | ICD-10-CM | POA: Diagnosis not present

## 2020-06-29 DIAGNOSIS — R109 Unspecified abdominal pain: Secondary | ICD-10-CM | POA: Diagnosis present

## 2020-06-29 LAB — URINALYSIS, ROUTINE W REFLEX MICROSCOPIC
Bacteria, UA: NONE SEEN
Bilirubin Urine: NEGATIVE
Glucose, UA: NEGATIVE mg/dL
Ketones, ur: NEGATIVE mg/dL
Leukocytes,Ua: NEGATIVE
Nitrite: NEGATIVE
Protein, ur: NEGATIVE mg/dL
Specific Gravity, Urine: 1.018 (ref 1.005–1.030)
pH: 5 (ref 5.0–8.0)

## 2020-06-29 LAB — COMPREHENSIVE METABOLIC PANEL
ALT: 16 U/L (ref 0–44)
AST: 20 U/L (ref 15–41)
Albumin: 4.4 g/dL (ref 3.5–5.0)
Alkaline Phosphatase: 60 U/L (ref 38–126)
Anion gap: 6 (ref 5–15)
BUN: 12 mg/dL (ref 6–20)
CO2: 28 mmol/L (ref 22–32)
Calcium: 9.1 mg/dL (ref 8.9–10.3)
Chloride: 103 mmol/L (ref 98–111)
Creatinine, Ser: 0.87 mg/dL (ref 0.61–1.24)
GFR calc Af Amer: >60 mL/min (ref >60)
GFR calc non Af Amer: >60 mL/min (ref >60)
Glucose, Bld: 96 mg/dL (ref 70–99)
Potassium: 4.4 mmol/L (ref 3.5–5.1)
Sodium: 137 mmol/L (ref 135–145)
Total Bilirubin: 0.3 mg/dL (ref 0.3–1.2)
Total Protein: 8.2 g/dL — ABNORMAL HIGH (ref 6.5–8.1)

## 2020-06-29 LAB — CBC
HCT: 47.6 % (ref 39.0–52.0)
Hemoglobin: 15.2 g/dL (ref 13.0–17.0)
MCH: 27.3 pg (ref 26.0–34.0)
MCHC: 31.9 g/dL (ref 30.0–36.0)
MCV: 85.5 fL (ref 80.0–100.0)
Platelets: 407 10*3/uL — ABNORMAL HIGH (ref 150–400)
RBC: 5.57 MIL/uL (ref 4.22–5.81)
RDW: 13.1 % (ref 11.5–15.5)
WBC: 6.2 10*3/uL (ref 4.0–10.5)
nRBC: 0 % (ref 0.0–0.2)

## 2020-06-29 LAB — SARS CORONAVIRUS 2 BY RT PCR (HOSPITAL ORDER, PERFORMED IN ~~LOC~~ HOSPITAL LAB): SARS Coronavirus 2: NEGATIVE

## 2020-06-29 LAB — LIPASE, BLOOD: Lipase: 33 U/L (ref 11–51)

## 2020-06-29 MED ORDER — SODIUM CHLORIDE 0.9 % IV BOLUS
1000.0000 mL | Freq: Once | INTRAVENOUS | Status: AC
  Administered 2020-06-29: 1000 mL via INTRAVENOUS

## 2020-06-29 MED ORDER — NAPROXEN 500 MG PO TABS: 500.0000 mg | ORAL_TABLET | Freq: Two times a day (BID) | ORAL | 0 refills | Status: DC

## 2020-06-29 MED ORDER — KETOROLAC TROMETHAMINE 30 MG/ML IJ SOLN
30.0000 mg | Freq: Once | INTRAMUSCULAR | Status: AC
  Administered 2020-06-29: 30 mg via INTRAVENOUS
  Filled 2020-06-29: qty 1

## 2020-06-29 NOTE — ED Notes (Signed)
Pt adds that has intermittent abd pains as well.

## 2020-06-29 NOTE — Discharge Instructions (Signed)
You have been evaluated for your symptoms.  Your EKG shows finding suggestive of potential pericarditis, or inflammation to the sac surrounding your heart.  Take naproxen as prescribed for pain.  Call and follow up with cardiologist in the next few days.  You may benefit from a cardiac echocardiogram for further assessment. Return if you have any concerns.

## 2020-06-29 NOTE — ED Triage Notes (Signed)
For seven day having flank pains with nausea, fatigue, and dysuria.

## 2020-06-29 NOTE — ED Provider Notes (Signed)
[facility]-EMERGENCY DEPT Provider Note   CSN: [id number] Arrival date & time: 06/29/20  1539     History Chief Complaint  Patient presents with  . Flank Pain  . Dysuria  . Nausea    Timothy Barry is a 29 y.o. male.  The history is provided by the patient. No language interpreter was used.  Flank Pain  Dysuria Associated symptoms: flank pain      29 year old male significant history of hypertension, GERD, presenting for evaluation of flank pain.  Patient report for the past 10 days he has had symptoms of pain primarily to his left flank and back region.  Pain is described as a uncomfortable tightness sensation radiates towards his chest with occasional heart palpitation.  Furthermore he also endorsed feeling generally weak, sleepy, and shakiness in his legs.  Is has been sleeping more than usual.  He endorsed occasional heart palpitation.  He endorsed feeling fatigued with mild exertion.  He also endorsed some tightness sensation in his throat.  He does not complain of any fever chills no runny nose sneezing or coughing no loss of taste or smell no nausea vomiting or diarrhea.  No prior history of kidney stones.  He is vaccinated for COVID-19.  He was seen by his PCP several days prior for this symptoms but no definitive diagnosis was made.  Currently he reports 5 out of 10 pain to his left flank.  No specific treatment tried at home.  Past Medical History:  Diagnosis Date  . Abnormal EKG 11/27/2018  . GERD (gastroesophageal reflux disease)   . HTN (hypertension)   . Palpitation 11/27/2018    Patient Active Problem List   Diagnosis Date Noted  . Paresthesia of arm 06/27/2020  . Chronic bilateral low back pain without sciatica 06/27/2020  . Daytime somnolence 06/27/2020  . Microscopic hematuria 06/27/2020  . Elevated blood-pressure reading without diagnosis of hypertension 06/27/2020  . Weakness 06/06/2020  . Dizziness 06/06/2020  . Nausea 06/06/2020    . Paresthesia 06/06/2020  . Fatigue 06/06/2020  . Physical deconditioning 06/06/2020  . BMI 37.0-37.9, adult 06/06/2020  . Impaired fasting blood sugar 04/11/2020  . Obesity 04/11/2020  . Furuncle 04/11/2020  . Chest discomfort 04/11/2020  . Palpitations 04/11/2020  . Loose stools 03/23/2020  . Colitis 03/23/2020  . Abdominal pain, epigastric 03/23/2020  . Early satiety 03/23/2020  . Flatulence 12/21/2019  . Bloating 12/21/2019  . Chest pain 12/09/2019  . Hyperglycemia 12/09/2019  . Gastroesophageal reflux disease 12/09/2019  . 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 Abnormal EKG 11/27/2018  . Palpitation 11/27/2018    Past Surgical History:  Procedure Laterality Date  . No prior surgery         Family History  Problem Relation Age of Onset  . Esophageal cancer Father   . Heart disease Maternal Grandmother 29       MI  . Stomach cancer Paternal Grandfather   . Stomach cancer Paternal Aunt     Social History   Tobacco Use  . Smoking status: Former Smoker    Types: Cigarettes    Quit date: 02/09/2016    Years since quitting: 4.3  . Smokeless tobacco: Never Used  Vaping Use  . Vaping Use: Never used  Substance Use Topics  . Alcohol use: Never  . Drug use: Never    Home Medications Prior to Admission medications   Medication Sig Start Date End Date Taking? Authorizing Provider  buPROPion (WELLBUTRIN XL) 150 MG 24 hr tablet Take  1 tablet (150 mg total) by mouth every morning. 06/28/20 06/28/21  Tysinger, Kermit Balo, PA-C  pantoprazole (PROTONIX) 40 MG tablet Take 1 tablet (40 mg total) by mouth daily. 06/28/20   Tysinger, Kermit Balo, PA-C    Allergies    Patient has no known allergies.  Review of Systems   Review of Systems  Genitourinary: Positive for flank pain.  All other systems reviewed and are negative.   Physical Exam Updated Vital Signs BP (!) 158/107   Pulse 66   Temp 98.9 F (37.2 C) (Oral)   Resp 18   SpO2 100%   Physical Exam Vitals and  nursing note reviewed.  Constitutional:      General: He is not in acute distress.    Appearance: He is well-developed.  HENT:     Head: Atraumatic.  Eyes:     Conjunctiva/sclera: Conjunctivae normal.  Cardiovascular:     Rate and Rhythm: Normal rate and regular rhythm.     Pulses: Normal pulses.     Heart sounds: Normal heart sounds. No murmur heard.  No friction rub. No gallop.   Pulmonary:     Effort: Pulmonary effort is normal.     Breath sounds: Normal breath sounds.  Abdominal:     Palpations: Abdomen is soft.     Tenderness: There is no abdominal tenderness. There is no right CVA tenderness or left CVA tenderness.  Musculoskeletal:     Cervical back: Neck supple.     Comments: 5 out of 5 strength to all 4 extremities.  Skin:    Findings: No rash.  Neurological:     Mental Status: He is alert and oriented to person, place, and time.  Psychiatric:        Mood and Affect: Mood normal.     ED Results / Procedures / Treatments   Labs (all labs ordered are listed, but only abnormal results are displayed) Labs Reviewed  URINALYSIS, ROUTINE W REFLEX MICROSCOPIC - Abnormal; Notable for the following components:      Result Value   Hgb urine dipstick SMALL (*)    All other components within normal limits  COMPREHENSIVE METABOLIC PANEL - Abnormal; Notable for the following components:   Total Protein 8.2 (*)    All other components within normal limits  CBC - Abnormal; Notable for the following components:   Platelets 407 (*)    All other components within normal limits  SARS CORONAVIRUS 2 BY RT PCR (HOSPITAL ORDER, PERFORMED IN [facility])  LIPASE, BLOOD    EKG None  ED ECG REPORT   Date: 06/29/2020  Rate: 71  Rhythm: normal sinus rhythm  QRS Axis: right  Intervals: normal  ST/T Wave abnormalities: ST elevations diffusely  Conduction Disutrbances:none  Narrative Interpretation:   Old EKG Reviewed: none available  I have personally reviewed the  EKG tracing and agree with the computerized printout as noted.   Radiology CT Renal Stone Study  Result Date: 06/29/2020 CLINICAL DATA:  Flank pain and dysuria EXAM: CT ABDOMEN AND PELVIS WITHOUT CONTRAST TECHNIQUE: Multidetector CT imaging of the abdomen and pelvis was performed following the standard protocol without IV contrast. COMPARISON:  11/21/2019 FINDINGS: Lower chest: No acute abnormality. Hepatobiliary: No focal liver abnormality is seen. No gallstones, gallbladder wall thickening, or biliary dilatation. Pancreas: Unremarkable. No pancreatic ductal dilatation or surrounding inflammatory changes. Spleen: Normal in size without focal abnormality. Adrenals/Urinary Tract: Adrenal glands are within normal limits. Kidneys demonstrate no renal calculi or obstructive changes. The  ureters are within normal limits. The bladder is partially distended. Stomach/Bowel: Minimal diverticular changes noted without evidence of diverticulitis. The appendix is within normal limits. Small bowel and stomach are within normal limits. Vascular/Lymphatic: No significant vascular findings are present. No enlarged abdominal or pelvic lymph nodes. Reproductive: Prostate is unremarkable. Other: No abdominal wall hernia or abnormality. No abdominopelvic ascites. Musculoskeletal: No acute or significant osseous findings. IMPRESSION: No acute abnormality in the abdomen and pelvis. No change from the prior exam. Electronically Signed   By: Alcide Clever M.D.   On: 06/29/2020 20:41    Procedures Procedures (including critical care time)  Medications Ordered in ED Medications  ketorolac (TORADOL) 30 MG/ML injection 30 mg (30 mg Intravenous Given 06/29/20 2109)  sodium chloride 0.9 % bolus 1,000 mL (1,000 mLs Intravenous New Bag/Given 06/29/20 2110)    ED Course  I have reviewed the triage vital signs and the nursing notes.  Pertinent labs & imaging results that were available during my care of the patient were reviewed by  me and considered in my medical decision making (see chart for details).    MDM Rules/Calculators/A&P                          BP 136/84 (BP Location: Left Arm)   Pulse 70   Temp 98.9 F (37.2 C) (Oral)   Resp 18   SpO2 100%   Final Clinical Impression(s) / ED Diagnoses Final diagnoses:  Acute nonspecific chest pain with low risk of coronary artery disease    Rx / DC Orders ED Discharge Orders         Ordered    naproxen (NAPROSYN) 500 MG tablet  2 times daily        06/29/20 2311         8:23 PM Patient endorsed recurrent left flank pain for the past 10 days but he also endorsed generalized fatigue and overall not feeling well.  He is vaccinated for COVID-19.  Work-up remarkable for trace of hemoglobin and urine dipsticks.  Will obtain CT renal stone study, EKG, and troponin, and COVID-19 test.  10:25 PM Covid test is negative, normal lipase, electrolyte panels are reassuring, normal white count, UA with trace hemoglobin and urine dipstick. CT scan of the abdomen pelvis without any acute finding. EKG shows diffuse ST elevation suggest acute pericarditis. Since patient has had Covid vaccination and now EKG showing potential acute pericarditis. I recommend NSAIDs use, as well as outpatient follow-up with cardiology for echocardiogram. With his condition ongoing for the past 10 days, low suspicion for ACS.  Timothy Barry was evaluated in Emergency Department on 06/29/2020 for the symptoms described in the history of present illness. He was evaluated in the context of the global COVID-19 pandemic, which necessitated consideration that the patient might be at risk for infection with the SARS-CoV-2 virus that causes COVID-19. Institutional protocols and algorithms that pertain to the evaluation of patients at risk for COVID-19 are in a state of rapid change based on information released by regulatory bodies including the CDC and federal and state organizations. These policies and  algorithms were followed during the patient's care in the ED.    Fayrene Helper, PA-C 06/29/20 2314    Mancel Bale, MD 06/30/20 [phone]

## 2020-06-30 ENCOUNTER — Telehealth: Payer: Self-pay | Admitting: Cardiology

## 2020-06-30 NOTE — Telephone Encounter (Signed)
Spoke with pt, aware we can see all of the things that were done in the ER and doing a virtual visit will be fine. Follow up scheduled

## 2020-06-30 NOTE — Telephone Encounter (Signed)
    Pt would like to set up appt with Dr. Jens Som or APP, gave first available 09/14, he said he needs to be seen sooner than that. He was in the ED and was told he have heart inflammation. Gave Virtual appt but he said he needs to see doctor in person.

## 2020-07-01 ENCOUNTER — Encounter: Payer: Self-pay | Admitting: Gastroenterology

## 2020-07-01 ENCOUNTER — Ambulatory Visit (INDEPENDENT_AMBULATORY_CARE_PROVIDER_SITE_OTHER): Payer: 59 | Admitting: Gastroenterology

## 2020-07-01 ENCOUNTER — Other Ambulatory Visit: Payer: 59

## 2020-07-01 VITALS — BP 120/78 | HR 81 | Ht 71.0 in | Wt 269.0 lb

## 2020-07-01 DIAGNOSIS — R1013 Epigastric pain: Secondary | ICD-10-CM

## 2020-07-01 DIAGNOSIS — R195 Other fecal abnormalities: Secondary | ICD-10-CM | POA: Diagnosis not present

## 2020-07-01 NOTE — Progress Notes (Signed)
Referring Provider: Jac Canavan, PA-C Primary Care Physician:  Jac Canavan, PA-C  Chief complaint:  Flank pain   IMPRESSION:  Dry throat not explained by EGD Diverticulosis without diverticulitis Several recent GI symptoms --Early satiety has resolved.  Gastric emptying study was normal --Upper abdominal pain has resolved.  Ultrasound and CT scan unrevealing  --Has chest discomfort but only associated with bending and twisting so does not sound GI in nature.  --Now complains of postprandial mid back pain,  ? Etiology unclear.  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  Active , chronic nonspecific colitis with nonnecrotizing granulomas and increase in eosinophils on rectal biopsy.  Stool test for infectious etiology was negative but were performed at later date so findings could have still been infectious.  --Diarrhea has resolved so I don't know that he needs additional workup.  --Low-threshold to repeat colonoscopy with recurrent symptoms  PLAN: Refer to ENT at patient request for evaluation of dry throat Follow-up in GI clinic PRN  Please see the "Patient Instructions" section for addition details about the plan.  HPI: Timothy Barry is a 29 y.o. male who returns in scheduled follow-up of post-prandial epigastric pain, distention, early satiety and change in bowel habits not explained by a normal CT scan 11/2019.   EGD and colonoscopy 03/18/20.  EGD was normal.  Gastric biopsies showed gastritis. Esophageal and duodenal biopsies were normal. Colonoscopy revealed some erythematous mucosa throughout the left colon extending into the transverse colon.  Left colon biopsies compatible with focal active chronic nonspecific colitis with increase in eosinophils . Transverse colon biopsies compatible with focal active colitis.  Right colon biopsies normal.  Subsequent  stool studies negative for parasites.  Gastric emptying scan 6/9 /21was normal.   Seen in the ED 06/29/20 for 10 days of bilateral flank pain with associated fatigue. He has ongoing sensation of chest tightness and palpitations. Work-up remarkable for trace of hemoglobin and urine dipsticks. CBC, troponin, and COVID-19 test were reassuring. EKG showed potential acute pericarditis and referral was made to cardiology. CT renal stone study showed no acute findings.   Bowel movements have normalized. He is now having a bowel movement every morning. Early satiety has improved. Appetite is okay. Reports that his throat is dry. Feels like there are gurgles when he swallows. Worsened when he moves.  Does not find that the pantoprazole is helping his symptoms.     Past Medical History:  Diagnosis Date  . Abnormal EKG 11/27/2018  . GERD (gastroesophageal reflux disease)   . HTN (hypertension)   . Palpitation 11/27/2018    Past Surgical History:  Procedure Laterality Date  . No prior surgery      Current Outpatient Medications  Medication Sig Dispense Refill  . buPROPion (WELLBUTRIN XL) 150 MG 24 hr tablet Take 1 tablet (150 mg total) by mouth every morning. 30 tablet 2  . naproxen (NAPROSYN) 500 MG tablet Take 1 tablet (500 mg total) by mouth 2 (two) times daily. 30 tablet 0  . pantoprazole (PROTONIX) 40 MG tablet Take 1 tablet (40 mg total) by mouth daily. 30 tablet 2   No current facility-administered medications for this visit.    Allergies as of 07/01/2020  . (No Known Allergies)    Family History  Problem Relation Age of Onset  . Esophageal cancer Father   . Heart disease Maternal Grandmother 29  MI  . Stomach cancer Paternal Grandfather   . Stomach cancer Paternal Aunt     Social History   Socioeconomic History  . Marital status: Single    Spouse name: Not on file  . Number of children: 0  . Years of education: Not on file  . Highest education level: Not on file    Occupational History  . Not on file  Tobacco Use  . Smoking status: Former Smoker    Types: Cigarettes    Quit date: 02/09/2016    Years since quitting: 4.3  . Smokeless tobacco: Never Used  Vaping Use  . Vaping Use: Never used  Substance and Sexual Activity  . Alcohol use: Never  . Drug use: Never  . Sexual activity: Not on file  Other Topics Concern  . Not on file  Social History Narrative  . Not on file   Social Determinants of Health   Financial Resource Strain:   . Difficulty of Paying Living Expenses: Not on file  Food Insecurity:   . Worried About Programme researcher, broadcasting/film/video in the Last Year: Not on file  . Ran Out of Food in the Last Year: Not on file  Transportation Needs:   . Lack of Transportation (Medical): Not on file  . Lack of Transportation (Non-Medical): Not on file  Physical Activity:   . Days of Exercise per Week: Not on file  . Minutes of Exercise per Session: Not on file  Stress:   . Feeling of Stress : Not on file  Social Connections:   . Frequency of Communication with Friends and Family: Not on file  . Frequency of Social Gatherings with Friends and Family: Not on file  . Attends Religious Services: Not on file  . Active Member of Clubs or Organizations: Not on file  . Attends Banker Meetings: Not on file  . Marital Status: Not on file  Intimate Partner Violence:   . Fear of Current or Ex-Partner: Not on file  . Emotionally Abused: Not on file  . Physically Abused: Not on file  . Sexually Abused: Not on file    Review of Systems: 12 system ROS is negative except as noted above.   Physical Exam: General:   Alert,  well-nourished, pleasant and cooperative in NAD Head:  Normocephalic and atraumatic. Eyes:  Sclera clear, no icterus.   Conjunctiva pink. Ears:  Normal auditory acuity. Nose:  No deformity, discharge,  or lesions. Mouth:  No deformity or lesions.   Neck:  Supple; no masses or thyromegaly. Lungs:  Clear throughout to  auscultation.   No wheezes. Heart:  Regular rate and rhythm; no murmurs. Abdomen:  Soft,nontender, nondistended, normal bowel sounds, no rebound or guarding. No hepatosplenomegaly.   Rectal:  Deferred  Msk:  Symmetrical. No boney deformities LAD: No inguinal or umbilical LAD Extremities:  No clubbing or edema. Neurologic:  Alert and  oriented x4;  grossly nonfocal Skin:  Intact without significant lesions or rashes. Psych:  Alert and cooperative. Normal mood and affect.   Lab Results: Recent Labs    06/29/20 1556  WBC 6.2  HGB 15.2  HCT 47.6  PLT 407*   BMET Recent Labs    06/29/20 1556  NA 137  K 4.4  CL 103  CO2 28  GLUCOSE 96  BUN 12  CREATININE 0.87  CALCIUM 9.1   LFT Recent Labs    06/29/20 1556  PROT 8.2*  ALBUMIN 4.4  AST 20  ALT 16  ALKPHOS 60  BILITOT 0.3    Studies/Results: DG Chest Portable 1 View  Result Date: 06/29/2020 CLINICAL DATA:  Sudden onset of mid chest pain. EXAM: PORTABLE CHEST 1 VIEW COMPARISON:  Lung bases from abdominal CT earlier today. Chest radiograph 01/27/2020. FINDINGS: The cardiomediastinal contours are normal. The lungs are clear. Pulmonary vasculature is normal. No consolidation, pleural effusion, or pneumothorax. No acute osseous abnormalities are seen. IMPRESSION: Negative portable AP view of the chest. Electronically Signed   By: Narda Rutherford M.D.   On: 06/29/2020 23:06   CT Renal Stone Study  Result Date: 06/29/2020 CLINICAL DATA:  Flank pain and dysuria EXAM: CT ABDOMEN AND PELVIS WITHOUT CONTRAST TECHNIQUE: Multidetector CT imaging of the abdomen and pelvis was performed following the standard protocol without IV contrast. COMPARISON:  11/21/2019 FINDINGS: Lower chest: No acute abnormality. Hepatobiliary: No focal liver abnormality is seen. No gallstones, gallbladder wall thickening, or biliary dilatation. Pancreas: Unremarkable. No pancreatic ductal dilatation or surrounding inflammatory changes. Spleen: Normal in size  without focal abnormality. Adrenals/Urinary Tract: Adrenal glands are within normal limits. Kidneys demonstrate no renal calculi or obstructive changes. The ureters are within normal limits. The bladder is partially distended. Stomach/Bowel: Minimal diverticular changes noted without evidence of diverticulitis. The appendix is within normal limits. Small bowel and stomach are within normal limits. Vascular/Lymphatic: No significant vascular findings are present. No enlarged abdominal or pelvic lymph nodes. Reproductive: Prostate is unremarkable. Other: No abdominal wall hernia or abnormality. No abdominopelvic ascites. Musculoskeletal: No acute or significant osseous findings. IMPRESSION: No acute abnormality in the abdomen and pelvis. No change from the prior exam. Electronically Signed   By: Alcide Clever M.D.   On: 06/29/2020 20:41      Genelda Roark L. Orvan Falconer, MD, MPH 07/01/2020, 10:03 AM

## 2020-07-01 NOTE — Patient Instructions (Signed)
If you are age 30 or older, your body mass index should be between 23-30. Your Body mass index is 37.52 kg/m. If this is out of the aforementioned range listed, please consider follow up with your Primary Care Provider.  If you are age 2 or younger, your body mass index should be between 19-25. Your Body mass index is 37.52 kg/m. If this is out of the aformentioned range listed, please consider follow up with your Primary Care Provider.   We will send your records to ENT, they will reach out to you to schedule.  Dr. Christia Reading  Address: [address], [place], [place] [address] Phone: [phone]  Follow up as needed.  Thank you for trusting me with your gastrointestinal care!    Tressia Danas, MD, MPH

## 2020-07-04 ENCOUNTER — Encounter: Payer: Self-pay | Admitting: Gastroenterology

## 2020-07-04 NOTE — Progress Notes (Signed)
Virtual Visit via Video Note   This visit type was conducted due to national recommendations for restrictions regarding the COVID-19 Pandemic (e.g. social distancing) in an effort to limit this patient's exposure and mitigate transmission in our community.  Due to his co-morbid illnesses, this patient is at least at moderate risk for complications without adequate follow up.  This format is felt to be most appropriate for this patient at this time.  All issues noted in this document were discussed and addressed.  A limited physical exam was performed with this format.  Please refer to the patient's chart for his consent to telehealth for [facility].      Date:  07/05/2020   ID:  Timothy Barry, DOB 17-Dec-1990, MRN [id number]  Patient Location:Home Provider Location: Home  PCP:  Jac Canavan, PA-C  Cardiologist:  Dr Jens Som  Evaluation Performed:  Follow-Up Visit  Chief Complaint:  FU CP and palpitations  History of Present Illness:    Echocardiogram January 2021 showed normal LV function and mild left atrial enlargement.  Monitor February 2021 showed sinus with occasional PAC and PVC.  Patient seen in the emergency room August 2021 with flank pain.  Abdominal CT showed no acute abnormality in the abdomen or pelvis.  Chest x-ray negative.  Electrocardiogram showed sinus rhythm, right axis deviation and diffuse ST elevation suggestive of pericarditis.  However some of ST changes similar to previous ECGs.  Patient prescribed course of nonsteroidals.  Since last seen patient continues to have chest pain.  It radiates from his kidneys and legs.  It is intermittent.  It is not related to food.  It can occur with activities or with lying down.  No relieving factors or associated symptoms.  He denies dyspnea or syncope.  He notices elevated heart rate only when he has his pain.  The patient does not have symptoms concerning for COVID-19 infection (fever, chills, cough, or new shortness of  breath).    Past Medical History:  Diagnosis Date  . Abnormal EKG 11/27/2018  . GERD (gastroesophageal reflux disease)   . HTN (hypertension)   . Palpitation 11/27/2018   Past Surgical History:  Procedure Laterality Date  . No prior surgery       Current Meds  Medication Sig  . pantoprazole (PROTONIX) 40 MG tablet Take 1 tablet (40 mg total) by mouth daily.     Allergies:   Patient has no known allergies.   Social History   Tobacco Use  . Smoking status: Former Smoker    Types: Cigarettes    Quit date: 02/09/2016    Years since quitting: 4.4  . Smokeless tobacco: Never Used  Vaping Use  . Vaping Use: Never used  Substance Use Topics  . Alcohol use: Never  . Drug use: Never     Family Hx: The patient's family history includes Esophageal cancer in his father; Heart disease (age of onset: 59) in his maternal grandmother; Stomach cancer in his paternal aunt and paternal grandfather.  ROS:   Please see the history of present illness.    No Fever, chills  or productive cough All other systems reviewed and are negative.  Recent Labs: 11/21/2019: TSH 0.603 06/29/2020: ALT 16; BUN 12; Creatinine, Ser 0.87; Hemoglobin 15.2; Platelets 407; Potassium 4.4; Sodium 137   Recent Lipid Panel No results found for: CHOL, TRIG, HDL, CHOLHDL, LDLCALC, LDLDIRECT  Wt Readings from Last 3 Encounters:  07/05/20 269 lb (122 kg)  07/01/20 269 lb (122 kg)  06/27/20  268 lb 6.4 oz (121.7 kg)     Objective:    Vital Signs:  BP (!) 145/98   Pulse 100   Ht 5\' 11"  (1.803 m)   Wt 269 lb (122 kg)   BMI 37.52 kg/m    VITAL SIGNS:  reviewed NAD Answers questions appropriately Normal affect Remainder of physical examination not performed (telehealth visit; coronavirus pandemic)  ASSESSMENT & PLAN:    1. Chest pain-symptoms are extremely atypical.  They radiate from his kidney and legs.  I am not convinced this is pericarditis.  We will arrange a repeat echocardiogram to make sure he  does not have a pericardial effusion.  If no effusion and LV function normal we will not pursue further cardiac evaluation. 2. Palpitations-previous monitor showed PACs and PVCs. 3. Elevated blood pressure-I have asked him to bring his cuff to the office and we will correlate with ours.  If accurate and blood pressure running high we will add medications as needed.  COVID-19 Education: The importance of social distancing was discussed today.  Time:   Today, I have spent 16 minutes with the patient with telehealth technology discussing the above problems.     Medication Adjustments/Labs and Tests Ordered: Current medicines are reviewed at length with the patient today.  Concerns regarding medicines are outlined above.   Tests Ordered: No orders of the defined types were placed in this encounter.   Medication Changes: No orders of the defined types were placed in this encounter.   Follow Up:  In Person in 6 month(s)  Signed, , MD  07/05/2020 8:22 AM    [facility]

## 2020-07-05 ENCOUNTER — Encounter: Payer: Self-pay | Admitting: Cardiology

## 2020-07-05 ENCOUNTER — Telehealth (INDEPENDENT_AMBULATORY_CARE_PROVIDER_SITE_OTHER): Payer: 59 | Admitting: Cardiology

## 2020-07-05 ENCOUNTER — Other Ambulatory Visit: Payer: Self-pay

## 2020-07-05 ENCOUNTER — Ambulatory Visit (INDEPENDENT_AMBULATORY_CARE_PROVIDER_SITE_OTHER): Payer: 59 | Admitting: Medical

## 2020-07-05 ENCOUNTER — Encounter: Payer: Self-pay | Admitting: Medical

## 2020-07-05 ENCOUNTER — Telehealth: Payer: Self-pay

## 2020-07-05 VITALS — BP 145/98 | HR 100 | Ht 71.0 in | Wt 269.0 lb

## 2020-07-05 VITALS — BP 132/74 | HR 81 | Ht 71.0 in | Wt 271.2 lb

## 2020-07-05 DIAGNOSIS — R5383 Other fatigue: Secondary | ICD-10-CM

## 2020-07-05 DIAGNOSIS — Z6837 Body mass index (BMI) 37.0-37.9, adult: Secondary | ICD-10-CM

## 2020-07-05 DIAGNOSIS — R002 Palpitations: Secondary | ICD-10-CM

## 2020-07-05 DIAGNOSIS — J392 Other diseases of pharynx: Secondary | ICD-10-CM

## 2020-07-05 DIAGNOSIS — G8929 Other chronic pain: Secondary | ICD-10-CM

## 2020-07-05 DIAGNOSIS — M545 Low back pain: Secondary | ICD-10-CM

## 2020-07-05 DIAGNOSIS — R202 Paresthesia of skin: Secondary | ICD-10-CM | POA: Diagnosis not present

## 2020-07-05 DIAGNOSIS — R072 Precordial pain: Secondary | ICD-10-CM

## 2020-07-05 DIAGNOSIS — R400 Somnolence: Secondary | ICD-10-CM

## 2020-07-05 DIAGNOSIS — R5381 Other malaise: Secondary | ICD-10-CM

## 2020-07-05 DIAGNOSIS — R3129 Other microscopic hematuria: Secondary | ICD-10-CM

## 2020-07-05 LAB — POCT URINALYSIS DIP (PROADVANTAGE DEVICE)
Bilirubin, UA: NEGATIVE
Blood, UA: NEGATIVE
Glucose, UA: NEGATIVE mg/dL
Ketones, POC UA: NEGATIVE mg/dL
Leukocytes, UA: NEGATIVE
Nitrite, UA: NEGATIVE
Protein Ur, POC: NEGATIVE mg/dL
Specific Gravity, Urine: 1.01
Urobilinogen, Ur: NEGATIVE
pH, UA: 6 (ref 5.0–8.0)

## 2020-07-05 NOTE — Addendum Note (Signed)
Addended by: Victorio Palm on: 07/05/2020 02:02 PM   Modules accepted: Orders

## 2020-07-05 NOTE — Progress Notes (Signed)
Done

## 2020-07-05 NOTE — Progress Notes (Signed)
Subjective: Chief Complaint  Patient presents with  . Follow-up    went to ED for chest pain    Here today for emergency department follow-up.  I just saw him on August 23 for multiple issues.  Since his last visit here he has been to the emergency department for flank pain, he has seen cardiology and gastroenterology for follow-up.  He saw gastroenterology, Dr. Orvan Falconer on 8/27 to follow-up on loose stool and abdominal pain.  She is making a referral to ENT due to dryness in his throat  He had a virtual consult with cardiology, Dr. Jens Som today.  There is a plan to repeat echocardiogram  He was seen in the emergency department for flank pain, no kidney stone found.  He was advised to follow-up with primary care which is why he is here today  He still has ongoing side pain 4 weeks.  He continues to complain of numerous other concerns including occasional numbness in his hands, fatigue, he notes some pains in his calves and legs after standing for 1 minute regardless of activity, he notes feeling weak in general, here to follow-up on blood that was seen in the urinalysis at the hospital  We referred him for sleep study last visit but he has not yet done the sleep study.  He just got the equipment few days ago  From his last visit here he had the following symptoms:  He think something is wrong with his kidneys.  He has pain in bilateral low back.  No injury no trauma.  He is not really exercising or doing any type of calisthenics regularly.  He denies blood in the urine, no blood in the stool, no burning with urination, no discharge.  He is not sexually active.  No concern for STD.  He notes a weird sound in his stomach at times he can hear out loud.  He notes some irritation of the throat and burping at times, upper belly discomfort at times.  He is not currently taking pantoprazole that was prescribed prior  His legs feel weak at times, no numbness or tingling in the legs.  He notes that  if he sleeps on one of his arms versus the other the arm will go numb within about 20 minutes.  He went into [employer] recently and check blood pressure and pulse several times in a row.  He got some elevated pulse and blood pressure readings as high as 155/90 blood pressure, and pulse as high as 110.  He feels weak sometimes.  Sometimes he feels so weak in his legs that he feels like he has been running for 2 hours.  No other aggravating or relieving factors.    No other c/o.  Past Medical History:  Diagnosis Date  . Abnormal EKG 11/27/2018  . GERD (gastroesophageal reflux disease)   . HTN (hypertension)   . Palpitation 11/27/2018   Family History  Problem Relation Age of Onset  . Esophageal cancer Father   . Heart disease Maternal Grandmother 29       MI  . Stomach cancer Paternal Grandfather   . Stomach cancer Paternal Aunt      The following portions of the patient's history were reviewed and updated as appropriate: allergies, current medications, past family history, past medical history, past social history, past surgical history and problem list.  ROS Otherwise as in subjective above    Objective: BP 132/74   Pulse 81   Ht 5\' 11"  (1.803 m)  Wt 271 lb 3.2 oz (123 kg)   SpO2 96%   BMI 37.82 kg/m   General appearance: alert, no distress, well developed, well nourished Neck: supple, no lymphadenopathy, no thyromegaly, no masses, no JVD, no bruits Heart: RRR, normal S1, S2, no murmurs Lungs: CTA bilaterally, no wheezes, rhonchi, or rales Back nontender, relatively normal range of motion Abdomen: +bs, soft, non tender, non distended, no masses, no hepatomegaly, no splenomegaly Pulses: 2+ radial pulses, 2+ pedal pulses, normal cap refill Ext: no edema MSK: nontender arma and legs Neuro: negative phalens and tinels, nonfocal exam     Assessment: Encounter Diagnoses  Name Primary?  . Chronic bilateral low back pain without sciatica Yes  . Microscopic hematuria    . Paresthesia of arm   . BMI 37.0-37.9, adult   . Physical deconditioning   . Fatigue, unspecified type   . Daytime somnolence      Plan: I have seen this patient on several visits now.  He has had several evaluations and seeing several specialist for his variety of complaints.  Several things have been ruled out.  I suspect there is a large component of anxiety, also he is deconditioned.  I again reiterated today the need to get some help in doing a introductory exercise and stretching plan regularly.  I will make a referral to physical therapy to get their assistance with this.  I think he needs some hand-holding one-on-one help getting started with this.  Also reiterated he needs to go ahead and do the sleep study and turned that in.  Some of his paresthesias and fatigue could be related to sleep apnea  Chronic low back pain-discussed the importance of stretching and regular exercise.  He is not currently exercising.  It would be helpful to get him in with a trainer or physical therapy or intro level exercise routine at the [facility] for example.  He seems to need some guidance on getting going with stretching and exercise.  I reassured him that his kidneys seem to be fine based on the recent labs in August 2 and based on prior scan earlier this year.  Referral to physical therapy  GERD-advised he continue pantoprazole which he has at home.  Avoid GERD triggers  My personal readings of his blood pressure is fine.  We discussed that blood pressure may fluctuate throughout the day but overall his blood pressure readings here at this office tend to be normal.  I believe he is getting himself worked up about things, causing some stress and anxiety.  Given his obesity, fatigue, weakness complaints, palpitations elevated pressures etc.-refer to sleep study to rule out sleep apnea  Weakness-I reviewed numerous lab results in the chart from this past several months.  This is subjective and we will  check a sleep study to rule out sleep apnea  I reviewed his recent cardiology notes.  They do plan to follow him every 6 months from now and I believe there is an ultrasound/echocardiogram planned soon  I reviewed his recent gastroenterology note.  They are referring him to ENT to evaluate for dry throat  I reviewed his urine under the microscope today and reassured him that his urinalysis was normal today and the micro showed no red blood cells or abnormalities   Timothy Barry was seen today for follow-up.  Diagnoses and all orders for this visit:  Chronic bilateral low back pain without sciatica  Microscopic hematuria -     POCT Urinalysis DIP (Proadvantage Device)  Paresthesia of  arm  BMI 37.0-37.9, adult  Physical deconditioning  Fatigue, unspecified type  Daytime somnolence    Follow up: pending sleep study

## 2020-07-05 NOTE — Telephone Encounter (Signed)
Records have been faxed to Dr Jenne Pane office  Will await appointment information

## 2020-07-05 NOTE — Patient Instructions (Signed)
Medication Instructions:  NO CHANGE *If you need a refill on your cardiac medications before your next appointment, please call your pharmacy*   Lab Work: If you have labs (blood work) drawn today and your tests are completely normal, you will receive your results only by: Marland Kitchen MyChart Message (if you have MyChart) OR . A paper copy in the mail If you have any lab test that is abnormal or we need to change your treatment, we will call you to review the results.   Testing/Procedures:  Your physician has requested that you have an echocardiogram. Echocardiography is a painless test that uses sound waves to create images of your heart. It provides your doctor with information about the size and shape of your heart and how well your heart's chambers and valves are working. This procedure takes approximately one hour. There are no restrictions for this procedure.[address]     Follow-Up: At [facility], you and your health needs are our priority.  As part of our continuing mission to provide you with exceptional heart care, we have created designated Provider Care Teams.  These Care Teams include your primary Cardiologist (physician) and Advanced Practice Providers (APPs -  Physician Assistants and Nurse Practitioners) who all work together to provide you with the care you need, when you need it.  We recommend signing up for the patient portal called "MyChart".  Sign up information is provided on this After Visit Summary.  MyChart is used to connect with patients for Virtual Visits (Telemedicine).  Patients are able to view lab/test results, encounter notes, upcoming appointments, etc.  Non-urgent messages can be sent to your provider as well.   To learn more about what you can do with MyChart, go to [email or web address].    Your next appointment:   6 month(s)  The format for your next appointment:   In Person  Provider:   You may see Olga Millers, MD or one of the  following Advanced Practice Providers on your designated Care Team:    Corine Shelter, PA-C  [place], [place]  Edd Fabian, [place]

## 2020-07-07 ENCOUNTER — Ambulatory Visit (HOSPITAL_COMMUNITY): Payer: 59 | Attending: Cardiology

## 2020-07-07 ENCOUNTER — Other Ambulatory Visit: Payer: Self-pay

## 2020-07-07 DIAGNOSIS — R072 Precordial pain: Secondary | ICD-10-CM | POA: Insufficient documentation

## 2020-07-07 LAB — ECHOCARDIOGRAM COMPLETE
Area-P 1/2: 5.75 cm2
S' Lateral: 2.5 cm

## 2020-07-07 NOTE — Telephone Encounter (Signed)
Dr Jenne Pane doesn't not accept [facility] health insurance.  New referral placed with Dr Dillard Cannon. Tried to contact patient and make him aware of the new referral sent to Dr Dillard Cannon.  My-chart message sent as there was no answer on the phone number listed in chart.

## 2020-07-15 ENCOUNTER — Other Ambulatory Visit: Payer: Self-pay | Admitting: Medical

## 2020-07-15 ENCOUNTER — Telehealth: Payer: Self-pay | Admitting: Medical

## 2020-07-15 MED ORDER — PROPRANOLOL HCL 10 MG PO TABS: 10.0000 mg | ORAL_TABLET | Freq: Two times a day (BID) | ORAL | 0 refills | Status: DC

## 2020-07-15 NOTE — Telephone Encounter (Signed)
   Patient just called after hours line to Dr. Susann Givens about frothy urine, elevated BP, heart racing when walking for 3 minutes of exericse.   I called and spoke to him.  He has had several recent visits here and at the emergency department.  I think he has an irrational fear that there is something wrong with him or if there is some other motive.  I have tried to reassure him many times that he has had things checked out including some recent chest x-rays, echocardiogram, recent labs, recent urine test.  We do not see anything wrong with his kidneys.  He has already had evaluation with cardiology  He is deconditioned.  He is obese.  I will have him start a trial of low-dose propanolol to see if that can calm down what he notes his palpitations or heart racing.  I asked him to call back in a week and let me know if he can tell a difference  I reassured him about the urine

## 2020-07-19 ENCOUNTER — Other Ambulatory Visit: Payer: Self-pay

## 2020-07-19 ENCOUNTER — Ambulatory Visit: Payer: 59 | Attending: Medical

## 2020-07-19 DIAGNOSIS — M62830 Muscle spasm of back: Secondary | ICD-10-CM | POA: Diagnosis present

## 2020-07-19 DIAGNOSIS — M25652 Stiffness of left hip, not elsewhere classified: Secondary | ICD-10-CM | POA: Insufficient documentation

## 2020-07-19 DIAGNOSIS — M6281 Muscle weakness (generalized): Secondary | ICD-10-CM | POA: Diagnosis present

## 2020-07-19 DIAGNOSIS — G8929 Other chronic pain: Secondary | ICD-10-CM | POA: Diagnosis present

## 2020-07-19 DIAGNOSIS — R293 Abnormal posture: Secondary | ICD-10-CM | POA: Insufficient documentation

## 2020-07-19 DIAGNOSIS — M545 Low back pain: Secondary | ICD-10-CM | POA: Insufficient documentation

## 2020-07-19 DIAGNOSIS — M25651 Stiffness of right hip, not elsewhere classified: Secondary | ICD-10-CM | POA: Diagnosis present

## 2020-07-19 NOTE — Patient Instructions (Signed)
2-3x/day SKC , piriformis , hamstring, hip flexors  Stretch 30 sec RT and LT 2-3 reps

## 2020-07-19 NOTE — Therapy (Signed)
[facility] [address] [place], [place], [address] Phone: [phone]   Fax:  [phone]  Physical Therapy Evaluation  Patient Details  Name: Timothy Barry MRN: [id number] Date of Birth: 27-Jun-1991 Referring Provider (PT): Crosby Oyster, PA-C   Encounter Date: 07/19/2020   PT End of Session - 07/19/20 1218    Visit Number 1    Number of Visits 12    Date for PT Re-Evaluation 09/02/20    Authorization Type [employer]    Authorization - Number of Visits 30    PT Start Time 1220    PT Stop Time 1300    PT Time Calculation (min) 40 min    Activity Tolerance Patient tolerated treatment well    Behavior During Therapy [facility] for tasks assessed/performed           Past Medical History:  Diagnosis Date  . Abnormal EKG 11/27/2018  . GERD (gastroesophageal reflux disease)   . HTN (hypertension)   . Palpitation 11/27/2018    Past Surgical History:  Procedure Laterality Date  . No prior surgery      There were no vitals filed for this visit.    Subjective Assessment - 07/19/20 1221    Subjective He reports chronic back pain. LBP for 2 years,    Said MD did not give any  reason for pain.    Pertinent History .    Limitations Lifting    How long can you sit comfortably? 10 min when in pain.    How long can you walk comfortably? As needed    Diagnostic tests none    Pain Score 0-No pain    Pain Location Back    Pain Orientation Right;Left;Mid;Lower    Pain Descriptors / Indicators Sharp;Aching    Pain Type Chronic pain    Pain Frequency Intermittent              [facility] PT Assessment - 07/19/20 0001      Assessment   Medical Diagnosis chronic LBP    Referring Provider (PT) Crosby Oyster, PA-C    Onset Date/Surgical Date --   2 years ago   Next MD Visit As needed    Prior Therapy no      Precautions   Precautions None      Restrictions   Weight Bearing Restrictions No      Balance Screen   Has the  patient fallen in the past 6 months No      Prior Function   Level of Independence Independent    Vocation Full time employment    Vocation Requirements truck driver.       Cognition   Overall Cognitive Status Within Functional Limits for tasks assessed      Observation/Other Assessments   Focus on Therapeutic Outcomes (FOTO)  63% to improve to 75%      Posture/Postural Control   Posture Comments RT shoulder elevated      ROM / Strength   AROM / PROM / Strength AROM;PROM;Strength      AROM   AROM Assessment Site Lumbar    Lumbar Flexion 60    Lumbar Extension 20   some pain in area of complaint.    Lumbar - Right Side Bend 20    Lumbar - Left Side Bend 20    Lumbar - Right Rotation 45    Lumbar - Left Rotation 45      PROM   Overall PROM Comments Hips wFL except +  Thomas test bilaterally      Strength   Overall Strength Comments LE strength WNL  Abdominaks Fair      Flexibility   Soft Tissue Assessment /Muscle Length yes    Hamstrings 60 degrees bilateral SLR       Palpation   Palpation comment Tender L4 to S1 with PA pressure                       Objective measurements completed on examination: See above findings.               PT Education - 07/19/20 1332    Education Details POC  HEP  reviewed FOTO scores and possible progress    Person(s) Educated Patient    Methods Explanation;Demonstration;Verbal cues;Handout;Tactile cues    Comprehension Verbalized understanding;Returned demonstration            PT Short Term Goals - 07/19/20 1309      PT SHORT TERM GOAL #1   Title He will be independent with intial HEP    Time 3    Period Weeks    Status New      PT SHORT TERM GOAL #2   Title He will report at least a 20% decr in pain episodes    Time 3    Period Weeks    Status New             PT Long Term Goals - 07/19/20 1310      PT LONG TERM GOAL #1   Title He will be independent with all HEP issued    Time 6     Period Weeks    Status New      PT LONG TERM GOAL #2   Title He will report 50% improvement in lower back pain    Time 6    Period Weeks    Status New      PT LONG TERM GOAL #3   Title Foto score will improve to 75% from 63%    Time 6    Period Weeks    Status New                  Plan - 07/19/20 1255    Clinical Impression Statement Mr Hitz presents with  chronic intermittant LBP  more tender today in lower back and with extension posturein. He has tight hip flexors and fair abdomnal strength.  This has been going on for 2 years so may take awhile to esolve . He needs Skilled PT to help with core strength exercise progression.He needs to be consistent with his HEP/    Personal Factors and Comorbidities Time since onset of injury/illness/exacerbation;Comorbidity 1    Comorbidities obesity    Examination-Activity Limitations Sit;Lift;Carry;Bend    Examination-Participation Restrictions Occupation    Stability/Clinical Decision Making Stable/Uncomplicated    Clinical Decision Making Low    Rehab Potential Good    PT Frequency 1x / week    PT Duration 6 weeks    PT Treatment/Interventions Electrical Stimulation;Moist Heat;Therapeutic exercise;Therapeutic activities;Patient/family education;Manual techniques;Passive range of motion    PT Next Visit Plan review HEP an add for core strength    PT Home Exercise Plan SKC, hanstring stretch,  piriformis stretch, hip flexor stretch    Consulted and Agree with Plan of Care Patient           Patient will benefit from skilled therapeutic intervention in order to improve the following deficits and  impairments:  Decreased activity tolerance, Pain, Postural dysfunction, Increased muscle spasms, Decreased range of motion, Decreased strength  Visit Diagnosis: Chronic bilateral low back pain without sciatica - Plan: PT plan of care cert/re-cert  Muscle spasm of back - Plan: PT plan of care cert/re-cert  Abnormal posture - Plan: PT  plan of care cert/re-cert  Stiffness of right hip, not elsewhere classified - Plan: PT plan of care cert/re-cert  Stiffness of left hip, not elsewhere classified - Plan: PT plan of care cert/re-cert  Muscle weakness (generalized) - Plan: PT plan of care cert/re-cert     Problem List Patient Active Problem List   Diagnosis Date Noted  . Paresthesia of arm 06/27/2020  . Chronic bilateral low back pain without sciatica 06/27/2020  . Daytime somnolence 06/27/2020  . Microscopic hematuria 06/27/2020  . Elevated blood-pressure reading without diagnosis of hypertension 06/27/2020  . Weakness 06/06/2020  . Dizziness 06/06/2020  . Nausea 06/06/2020  . Paresthesia 06/06/2020  . Fatigue 06/06/2020  . Physical deconditioning 06/06/2020  . BMI 37.0-37.9, adult 06/06/2020  . Impaired fasting blood sugar 04/11/2020  . Furuncle 04/11/2020  . Chest discomfort 04/11/2020  . Palpitations 04/11/2020  . Loose stools 03/23/2020  . Colitis 03/23/2020  . Abdominal pain, epigastric 03/23/2020  . Early satiety 03/23/2020  . Flatulence 12/21/2019  . Bloating 12/21/2019  . Chest pain 12/09/2019  . Hyperglycemia 12/09/2019  . Gastroesophageal reflux disease 12/09/2019  . 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 Abnormal EKG 11/27/2018  . Palpitation 11/27/2018    Caprice Red  PT 07/19/2020, 1:35 PM  [facility] [address] [place], [place], [address] Phone: [phone]   Fax:  [phone]  Name: Mikell Kazlauskas MRN: [id number] Date of Birth: [birth year]

## 2020-07-26 NOTE — Telephone Encounter (Signed)
Dr Ezzard Standing appointment has been scheduled for 07-29-2020 @ 330 pm

## 2020-07-26 NOTE — Telephone Encounter (Signed)
Thank you :)

## 2020-07-29 ENCOUNTER — Other Ambulatory Visit: Payer: Self-pay

## 2020-07-29 ENCOUNTER — Ambulatory Visit: Payer: 59 | Admitting: Physical Therapy

## 2020-07-29 ENCOUNTER — Encounter: Payer: Self-pay | Admitting: Physical Therapy

## 2020-07-29 ENCOUNTER — Encounter (INDEPENDENT_AMBULATORY_CARE_PROVIDER_SITE_OTHER): Payer: Self-pay | Admitting: Otolaryngology

## 2020-07-29 ENCOUNTER — Ambulatory Visit (INDEPENDENT_AMBULATORY_CARE_PROVIDER_SITE_OTHER): Payer: 59 | Admitting: Otolaryngology

## 2020-07-29 VITALS — Temp 96.6°F

## 2020-07-29 DIAGNOSIS — K219 Gastro-esophageal reflux disease without esophagitis: Secondary | ICD-10-CM

## 2020-07-29 DIAGNOSIS — M25652 Stiffness of left hip, not elsewhere classified: Secondary | ICD-10-CM

## 2020-07-29 DIAGNOSIS — M545 Low back pain, unspecified: Secondary | ICD-10-CM

## 2020-07-29 DIAGNOSIS — R293 Abnormal posture: Secondary | ICD-10-CM

## 2020-07-29 DIAGNOSIS — M25651 Stiffness of right hip, not elsewhere classified: Secondary | ICD-10-CM

## 2020-07-29 DIAGNOSIS — M6281 Muscle weakness (generalized): Secondary | ICD-10-CM

## 2020-07-29 DIAGNOSIS — M62830 Muscle spasm of back: Secondary | ICD-10-CM

## 2020-07-29 NOTE — Therapy (Signed)
[facility] [address] [place], [place], [address] Phone: [phone]   Fax:  [phone]  Physical Therapy Treatment  Patient Details  Name: Timothy Barry MRN: [id number] Date of Birth: [birth year] Referring Provider (PT): Crosby Oyster, PA-C   Encounter Date: 07/29/2020   PT End of Session - 07/29/20 1328    Visit Number 2    Number of Visits 12    Date for PT Re-Evaluation 09/02/20    Authorization Type [employer]    PT Start Time 1323   arrived a few minutes late   PT Stop Time 1408    PT Time Calculation (min) 45 min    Activity Tolerance Patient tolerated treatment well    Behavior During Therapy [facility] for tasks assessed/performed           Past Medical History:  Diagnosis Date  . Abnormal EKG 11/27/2018  . GERD (gastroesophageal reflux disease)   . HTN (hypertension)   . Palpitation 11/27/2018    Past Surgical History:  Procedure Laterality Date  . No prior surgery      There were no vitals filed for this visit.   Subjective Assessment - 07/29/20 1325    Subjective No change in LBP since eval. Pt. reports tried HEP some left knee soreness with piriformis stretch so has since not been doing exercises.    Currently in Pain? Yes    Pain Score 4     Pain Location Back    Pain Orientation Lower    Pain Descriptors / Indicators Sharp    Pain Type Chronic pain    Pain Onset More than a month ago    Pain Frequency Intermittent    Aggravating Factors  bending forward particularly for prolonged periods    Pain Relieving Factors lying down/rest    Effect of Pain on Daily Activities limits positional tolerance                             [facility] Adult PT Treatment/Exercise - 07/29/20 0001      Exercises   Exercises Lumbar      Lumbar Exercises: Stretches   Passive Hamstring Stretch Right;Left;2 reps;30 seconds    Single Knee to Chest Stretch Right;Left;3 reps;20 seconds    Single Knee  to Chest Stretch Limitations showed HEP variation with strap if having knee pain    Piriformis Stretch --    Figure 4 Stretch 2 reps;30 seconds    Figure 4 Stretch Limitations bilat, able without knee pain with cues for form      Lumbar Exercises: Aerobic   Nustep L4 x 5 min UE/LE      Lumbar Exercises: Standing   Functional Squats 15 reps    Functional Squats Limitations TRX squat-cues ot avoid knee flexion past toes    Shoulder Extension AROM;Strengthening;Both;15 reps    Theraband Level (Shoulder Extension) Level 4 (Blue)    Other Standing Lumbar Exercises Pall of press with doubled blue band x 15 reps ea. bilat.      Lumbar Exercises: Supine   AB Set Limitations attempted but diffiuclty feeling contraction    Pelvic Tilt 10 reps    Clam 15 reps    Clam Limitations green band    Bent Knee Raise 20 reps    Bent Knee Raise Limitations x 10 LE only, x 10 with opposite arm raise    Bridge 10 reps    Other Supine Lumbar Exercises  hip adduction isometric with ball squeeze 5 second holds x 10 reps                    PT Short Term Goals - 07/19/20 1309      PT SHORT TERM GOAL #1   Title He will be independent with intial HEP    Time 3    Period Weeks    Status New      PT SHORT TERM GOAL #2   Title He will report at least a 20% decr in pain episodes    Time 3    Period Weeks    Status New             PT Long Term Goals - 07/19/20 1310      PT LONG TERM GOAL #1   Title He will be independent with all HEP issued    Time 6    Period Weeks    Status New      PT LONG TERM GOAL #2   Title He will report 50% improvement in lower back pain    Time 6    Period Weeks    Status New      PT LONG TERM GOAL #3   Title Foto score will improve to 75% from 63%    Time 6    Period Weeks    Status New                 Plan - 07/29/20 1450    Clinical Impression Statement Pt. returns for first follow up reporting limited HEP performance due to knee pain  with one of the stretches. Reviewed form and able to return demos without knee pain. Spent session progressing with core/lumbar stabilization overall with good tolerance. Given symptom chronicity expect progress for symptom reduction will take more time and exercise peformance.    Personal Factors and Comorbidities Time since onset of injury/illness/exacerbation;Comorbidity 1    Comorbidities obesity    Examination-Activity Limitations Sit;Lift;Carry;Bend    Examination-Participation Restrictions Occupation    Stability/Clinical Decision Making Stable/Uncomplicated    Clinical Decision Making Low    Rehab Potential Good    PT Frequency 1x / week    PT Duration 6 weeks    PT Treatment/Interventions Electrical Stimulation;Moist Heat;Therapeutic exercise;Therapeutic activities;Patient/family education;Manual techniques;Passive range of motion    PT Next Visit Plan continue review/progress HEP and lumbar/core stabilization, stretches, if tolerated for knees consider quadruped stabilization    PT Home Exercise Plan Access code: [email or web address]    Consulted and Agree with Plan of Care Patient           Patient will benefit from skilled therapeutic intervention in order to improve the following deficits and impairments:  Decreased activity tolerance, Pain, Postural dysfunction, Increased muscle spasms, Decreased range of motion, Decreased strength  Visit Diagnosis: Chronic bilateral low back pain without sciatica  Muscle spasm of back  Abnormal posture  Stiffness of right hip, not elsewhere classified  Stiffness of left hip, not elsewhere classified  Muscle weakness (generalized)     Problem List Patient Active Problem List   Diagnosis Date Noted  . Paresthesia of arm 06/27/2020  . Chronic bilateral low back pain without sciatica 06/27/2020  . Daytime somnolence 06/27/2020  . Microscopic hematuria 06/27/2020  . Elevated blood-pressure reading without diagnosis of hypertension 06/27/2020   . Weakness 06/06/2020  . Dizziness 06/06/2020  . Nausea 06/06/2020  . Paresthesia 06/06/2020  . Fatigue 06/06/2020  . Physical deconditioning 06/06/2020  .  BMI 37.0-37.9, adult 06/06/2020  . Impaired fasting blood sugar 04/11/2020  . Furuncle 04/11/2020  . Chest discomfort 04/11/2020  . Palpitations 04/11/2020  . Loose stools 03/23/2020  . Colitis 03/23/2020  . Abdominal pain, epigastric 03/23/2020  . Early satiety 03/23/2020  . Flatulence 12/21/2019  . Bloating 12/21/2019  . Chest pain 12/09/2019  . Hyperglycemia 12/09/2019  . Gastroesophageal reflux disease 12/09/2019  . 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 Abnormal EKG 11/27/2018  . Palpitation 11/27/2018    Lazarus Gowda, PT, DPT 07/29/20 2:55 PM  [facility] [address] [place], [place], [address] Phone: [phone]   Fax:  [phone]  Name: Timothy Barry MRN: [id number] Date of Birth: [birth year]

## 2020-07-29 NOTE — Progress Notes (Signed)
HPI: Timothy Barry is a 29 y.o. male who presents is referred by Dr. Orvan Falconer for evaluation of throat complaints.  He describes sensation of stuff getting caught in his throat and points to the area in the middle of his neck just above the suprasternal notch and states that extends down a few inches.  Also states that his throat is dry at times.  He does not have any trouble eating food.  He does have a history of acid reflux and takes pantoprazole 40 mg in the morning.  He quit smoking about 2 years ago.  He has had no hoarseness or voice changes. He apparently has had previous upper endoscopy which was clear.Marland Kitchen  He is referred by Dr. Orvan Falconer for further evaluation of throat complaints.  Past Medical History:  Diagnosis Date  . Abnormal EKG 11/27/2018  . GERD (gastroesophageal reflux disease)   . HTN (hypertension)   . Palpitation 11/27/2018   Past Surgical History:  Procedure Laterality Date  . No prior surgery     Social History   Socioeconomic History  . Marital status: Single    Spouse name: Not on file  . Number of children: 0  . Years of education: Not on file  . Highest education level: Not on file  Occupational History  . Not on file  Tobacco Use  . Smoking status: Former Smoker    Years: 2.00    Types: Cigarettes    Quit date: 02/09/2016    Years since quitting: 4.4  . Smokeless tobacco: Never Used  . Tobacco comment: [place] daily 1 x  Vaping Use  . Vaping Use: Never used  Substance and Sexual Activity  . Alcohol use: Never  . Drug use: Never  . Sexual activity: Not on file  Other Topics Concern  . Not on file  Social History Narrative  . Not on file   Social Determinants of Health   Financial Resource Strain:   . Difficulty of Paying Living Expenses: Not on file  Food Insecurity:   . Worried About Programme researcher, broadcasting/film/video in the Last Year: Not on file  . Ran Out of Food in the Last Year: Not on file  Transportation Needs:   . Lack of Transportation (Medical):  Not on file  . Lack of Transportation (Non-Medical): Not on file  Physical Activity:   . Days of Exercise per Week: Not on file  . Minutes of Exercise per Session: Not on file  Stress:   . Feeling of Stress : Not on file  Social Connections:   . Frequency of Communication with Friends and Family: Not on file  . Frequency of Social Gatherings with Friends and Family: Not on file  . Attends Religious Services: Not on file  . Active Member of Clubs or Organizations: Not on file  . Attends Banker Meetings: Not on file  . Marital Status: Not on file   Family History  Problem Relation Age of Onset  . Esophageal cancer Father   . Heart disease Maternal Grandmother 29       MI  . Stomach cancer Paternal Grandfather   . Stomach cancer Paternal Aunt    No Known Allergies Prior to Admission medications   Medication Sig Start Date End Date Taking? Authorizing Provider  pantoprazole (PROTONIX) 40 MG tablet Take 1 tablet (40 mg total) by mouth daily. 06/28/20   Tysinger, Kermit Balo, PA-C  propranolol (INDERAL) 10 MG tablet Take 1 tablet (10 mg total) by mouth 2 (  two) times daily. 07/15/20   Tysinger, Kermit Balo, PA-C     Positive ROS: Otherwise negative  All other systems have been reviewed and were otherwise negative with the exception of those mentioned in the HPI and as above.  Physical Exam: Constitutional: Alert, well-appearing, no acute distress Ears: External ears without lesions or tenderness. Ear canals are clear bilaterally with intact, clear TMs.  Nasal: External nose without lesions. Septum slightly deviated to the left with mild rhinitis with clear mucus discharge.  Both middle meatus regions are clear with no clinical evidence of active infection. Oral: Lips and gums without lesions. Tongue and palate mucosa without lesions. Posterior oropharynx clear.  Tonsils are 2+ and symmetric bilaterally. Fiberoptic laryngoscopy was performed through the right nostril.  The  nasopharynx was clear.  The base of tongue vallecula and epiglottis were normal.  Vocal cords were clear bilaterally with normal vocal mobility.  He had moderate edema of the arytenoid mucosa with clear mucosa otherwise.  The fiberoptic laryngoscope was able to pass through the upper esophageal sphincter without difficulty with clear upper cervical esophagus. Neck: No palpable adenopathy or masses Respiratory: Breathing comfortably  Skin: No facial/neck lesions or rash noted.  Laryngoscopy  Date/Time: 07/29/2020 3:53 PM Performed by: Drema Halon, MD Authorized by: Drema Halon, MD   Consent:    Consent obtained:  Verbal   Consent given by:  Patient Procedure details:    Indications: direct visualization of the upper aerodigestive tract     Medication:  Afrin   Instrument: flexible fiberoptic laryngoscope     Scope location: right nare   Sinus:    Right nasopharynx: normal   Mouth:    Oropharynx: normal     Vallecula: normal     Base of tongue: normal     Epiglottis: normal   Throat:    True vocal cords: normal   Comments:     On fiberoptic laryngoscopy the upper airway was clear to examination as was the vocal cords. He had moderate edema of the arytenoid mucosa but no mucosal lesions noted. I was able to pass the fiberoptic laryngoscope through the upper esophageal sphincter without difficulty and the upper cervical esophagus was clear.    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  Plan: Recommended taking his pantoprazole before dinner instead of first thing in the morning as this will provide better coverage at night when he probably has more reflux. Also suggested drinking plenty of water throughout the day as he complains of occasional dry throat. Reassured him of normal upper airway examination with no obvious structural abnormalities. He will  follow-up as needed.   Narda Bonds, MD   CC:

## 2020-08-05 ENCOUNTER — Encounter: Payer: Self-pay | Admitting: Physical Therapy

## 2020-08-05 ENCOUNTER — Other Ambulatory Visit: Payer: Self-pay

## 2020-08-05 ENCOUNTER — Ambulatory Visit: Payer: 59 | Attending: Medical | Admitting: Physical Therapy

## 2020-08-05 DIAGNOSIS — M6281 Muscle weakness (generalized): Secondary | ICD-10-CM

## 2020-08-05 DIAGNOSIS — M25652 Stiffness of left hip, not elsewhere classified: Secondary | ICD-10-CM | POA: Insufficient documentation

## 2020-08-05 DIAGNOSIS — R293 Abnormal posture: Secondary | ICD-10-CM | POA: Insufficient documentation

## 2020-08-05 DIAGNOSIS — G8929 Other chronic pain: Secondary | ICD-10-CM | POA: Insufficient documentation

## 2020-08-05 DIAGNOSIS — M25651 Stiffness of right hip, not elsewhere classified: Secondary | ICD-10-CM

## 2020-08-05 DIAGNOSIS — M545 Low back pain, unspecified: Secondary | ICD-10-CM | POA: Diagnosis present

## 2020-08-05 DIAGNOSIS — M62830 Muscle spasm of back: Secondary | ICD-10-CM | POA: Diagnosis present

## 2020-08-05 NOTE — Therapy (Addendum)
[place], [place], [address] Phone: [phone]   Fax:  [phone]  Physical Therapy Treatment/Discharge  Patient Details  Name: Timothy Barry MRN: [id number] Date of Birth: [birth year] Referring Provider (PT): Chana Bode, PA-C   Encounter Date: 08/05/2020   PT End of Session - 08/05/20 1240    Visit Number 3    Number of Visits 12    Date for PT Re-Evaluation 09/02/20    Authorization Type [employer]    PT Start Time 1237    PT Stop Time 1316    PT Time Calculation (min) 39 min    Activity Tolerance Patient tolerated treatment well    Behavior During Therapy [facility] for tasks assessed/performed           Past Medical History:  Diagnosis Date  . Abnormal EKG 11/27/2018  . GERD (gastroesophageal reflux disease)   . HTN (hypertension)   . Palpitation 11/27/2018    Past Surgical History:  Procedure Laterality Date  . No prior surgery      There were no vitals filed for this visit.   Subjective Assessment - 08/05/20 1237    Subjective Pt. reports HEP going OK since last session. Mild improvement for back pain and no pain pre-tx. this PM.    Currently in Pain? No/denies                             [facility] Adult PT Treatment/Exercise - 08/05/20 0001      Lumbar Exercises: Stretches   Passive Hamstring Stretch Right;Left;2 reps;30 seconds    Piriformis Stretch Right;Left;2 reps;30 seconds    Other Lumbar Stretch Exercise sidelying manual hip flexor stretch 2 x 30 sec ea. bilat      Lumbar Exercises: Aerobic   Nustep L5 x 5 min UE/LE      Lumbar Exercises: Standing   Functional Squats 20 reps    Functional Squats Limitations TRX squat 2x10    Other Standing Lumbar Exercises Freemotion cable row 10 lb. 2x10 bilat. UE, cable extension 7 lbs. 2x10 bilat. UE    Other Standing Lumbar Exercises pall off press with Freemotion cable 10 lbs. x 15 reps ea. direction bilat.       Lumbar Exercises: Supine   Dead Bug 20 reps    Dead Bug Limitations 2 lb. ankle weight ea LE and 2 lb. DB ea. UE    Bridge 20 reps    Bridge Limitations legs on 55 cm P-ball    Bridge with clamshell 15 reps   blue band     Lumbar Exercises: Quadruped   Madcat/Old Horse 15 reps    Single Arm Raise Right;Left;10 reps    Straight Leg Raise 10 reps    Straight Leg Raises Limitations bilat.    Other Quadruped Lumbar Exercises "child's pose" stretch 3 x 10 sec                  PT Education - 08/05/20 1404    Education Details HEP updates    Person(s) Educated Patient    Methods Explanation;Demonstration;Verbal cues;Handout    Comprehension Returned demonstration;Verbalized understanding            PT Short Term Goals - 07/19/20 1309      PT SHORT TERM GOAL #1   Title He will be independent with intial HEP    Time 3    Period Weeks    Status  New      PT SHORT TERM GOAL #2   Title He will report at least a 20% decr in pain episodes    Time 3    Period Weeks    Status New             PT Long Term Goals - 07/19/20 1310      PT LONG TERM GOAL #1   Title He will be independent with all HEP issued    Time 6    Period Weeks    Status New      PT LONG TERM GOAL #2   Title He will report 50% improvement in lower back pain    Time 6    Period Weeks    Status New      PT LONG TERM GOAL #3   Title Foto score will improve to 75% from 63%    Time 6    Period Weeks    Status New                 Plan - 08/05/20 1256    Clinical Impression Statement Continued progression of back and core strengthening/stabilization focus with good tolerance. Mild improvement as noted in subjective with LBP symptoms so responding well given symptom chronicity with gradual progress expected.    Personal Factors and Comorbidities Time since onset of injury/illness/exacerbation;Comorbidity 1    Comorbidities obesity    Examination-Activity Limitations Sit;Lift;Carry;Bend     Examination-Participation Restrictions Occupation    Stability/Clinical Decision Making Stable/Uncomplicated    Clinical Decision Making Low    Rehab Potential Good    PT Frequency 1x / week    PT Duration 6 weeks    PT Treatment/Interventions Electrical Stimulation;Moist Heat;Therapeutic exercise;Therapeutic activities;Patient/family education;Manual techniques;Passive range of motion    PT Next Visit Plan continue review/progress HEP and lumbar/core stabilization, stretches as tolerated    PT Home Exercise Plan Access code: [email or web address]    Consulted and Agree with Plan of Care Patient           Patient will benefit from skilled therapeutic intervention in order to improve the following deficits and impairments:  Decreased activity tolerance, Pain, Postural dysfunction, Increased muscle spasms, Decreased range of motion, Decreased strength  Visit Diagnosis: Chronic bilateral low back pain without sciatica  Muscle spasm of back  Abnormal posture  Stiffness of right hip, not elsewhere classified  Stiffness of left hip, not elsewhere classified  Muscle weakness (generalized)     Problem List Patient Active Problem List   Diagnosis Date Noted  . Paresthesia of arm 06/27/2020  . Chronic bilateral low back pain without sciatica 06/27/2020  . Daytime somnolence 06/27/2020  . Microscopic hematuria 06/27/2020  . Elevated blood-pressure reading without diagnosis of hypertension 06/27/2020  . Weakness 06/06/2020  . Dizziness 06/06/2020  . Nausea 06/06/2020  . Paresthesia 06/06/2020  . Fatigue 06/06/2020  . Physical deconditioning 06/06/2020  . BMI 37.0-37.9, adult 06/06/2020  . Impaired fasting blood sugar 04/11/2020  . Furuncle 04/11/2020  . Chest discomfort 04/11/2020  . Palpitations 04/11/2020  . Loose stools 03/23/2020  . Colitis 03/23/2020  . Abdominal pain, epigastric 03/23/2020  . Early satiety 03/23/2020  . Flatulence 12/21/2019  . Bloating 12/21/2019  . Chest pain  12/09/2019  . Hyperglycemia 12/09/2019  . Gastroesophageal reflux disease 12/09/2019  . 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 Abnormal EKG 11/27/2018  . Palpitation 11/27/2018    Beaulah Dinning, PT, DPT 08/05/20 2:05 PM  [facility] [address]  [place], [place], [address] Phone: [phone]   Fax:  [phone]  Name: Michelle Vanhise MRN: [id number] Date of Birth: May 30, 1991  PHYSICAL THERAPY DISCHARGE SUMMARY  Visits from Start of Care: 3 Current functional level related to goals / functional outcomes: Unknown as he did not return after this visit   Remaining deficits: See above    Education / Equipment: HEP  Plan:                                                    Patient goals were not met. Patient is being discharged due to not returning since the last visit.  ?????    Pearson Forster PT 09/12/20

## 2020-08-12 ENCOUNTER — Telehealth: Payer: Self-pay | Admitting: Medical

## 2020-08-12 ENCOUNTER — Ambulatory Visit: Payer: 59 | Admitting: Physical Therapy

## 2020-08-12 NOTE — Telephone Encounter (Signed)
I am happy to report that his sleep study shows no sleep apnea

## 2020-08-15 NOTE — Telephone Encounter (Signed)
Result sent via mychart.   

## 2020-08-16 ENCOUNTER — Other Ambulatory Visit: Payer: Self-pay

## 2020-08-16 ENCOUNTER — Encounter: Payer: Self-pay | Admitting: Family Medicine

## 2020-08-16 ENCOUNTER — Ambulatory Visit (INDEPENDENT_AMBULATORY_CARE_PROVIDER_SITE_OTHER): Payer: 59 | Admitting: Family Medicine

## 2020-08-16 VITALS — BP 124/86 | HR 85 | Temp 99.0°F | Wt 267.8 lb

## 2020-08-16 DIAGNOSIS — R6889 Other general symptoms and signs: Secondary | ICD-10-CM | POA: Diagnosis not present

## 2020-08-16 NOTE — Progress Notes (Signed)
   Subjective:    Patient ID: Timothy Barry, male    DOB: 25-Sep-1991, 29 y.o.   MRN: [id number]  HPI He is here complaining of bilateral calf pain that he has had for the last several days.  He then mentioned his feet being warm, concerned about heart inflammation, flank pain,, frequency increased heart rate.  Apparently he also had a hemoglobin A1c of 5.8 and is concerned about diabetes. He also states that he took a month off of work because of the various aches and pains but is now back working.  Review of Systems     Objective:   Physical Exam Alert and in no distress.  Urine dipstick was negative.  Exam of the cast shows no swelling, warmth or tenderness.  Feet show normal sensation as well as pulses. Review of his record indicates that he has seen multiple specialists over the last several months for various aches and pains and complaints.      Assessment & Plan:  Multiple complaints I reassured him that none of his symptoms were diabetes related.  I reviewed the echocardiogram and explained that he does not have heart inflammation.  I explained that he has no evidence of urinary tract infection. The multiple complaints make me wonder whether there is a psychological component to this

## 2020-08-16 NOTE — Patient Instructions (Signed)
You can take 2 Tylenol 4 times per day for the leg pain and follow-up with Vincenza Hews concerning your general health

## 2020-08-19 ENCOUNTER — Ambulatory Visit: Payer: 59 | Admitting: Physical Therapy

## 2020-08-29 ENCOUNTER — Encounter: Payer: Self-pay | Admitting: Medical

## 2020-09-22 NOTE — Therapy (Signed)
[place], [place], [address] Phone: [phone]   Fax:  [phone]  Physical Therapy Treatment/Discharge  Patient Details  Name: Timothy Barry MRN: [id number] Date of Birth: 12-26-1990 Referring Provider (PT): Chana Bode, PA-C   Encounter Date: 08/05/2020    Past Medical History:  Diagnosis Date  . Abnormal EKG 11/27/2018  . GERD (gastroesophageal reflux disease)   . HTN (hypertension)   . Palpitation 11/27/2018    Past Surgical History:  Procedure Laterality Date  . No prior surgery      There were no vitals filed for this visit.                                PT Short Term Goals - 07/19/20 1309      PT SHORT TERM GOAL #1   Title He will be independent with intial HEP    Time 3    Period Weeks    Status New      PT SHORT TERM GOAL #2   Title He will report at least a 20% decr in pain episodes    Time 3    Period Weeks    Status New             PT Long Term Goals - 07/19/20 1310      PT LONG TERM GOAL #1   Title He will be independent with all HEP issued    Time 6    Period Weeks    Status New      PT LONG TERM GOAL #2   Title He will report 50% improvement in lower back pain    Time 6    Period Weeks    Status New      PT LONG TERM GOAL #3   Title Foto score will improve to 75% from 63%    Time 6    Period Weeks    Status New                  Patient will benefit from skilled therapeutic intervention in order to improve the following deficits and impairments:  Decreased activity tolerance, Pain, Postural dysfunction, Increased muscle spasms, Decreased range of motion, Decreased strength  Visit Diagnosis: Chronic bilateral low back pain without sciatica  Muscle spasm of back  Abnormal posture  Stiffness of right hip, not elsewhere classified  Stiffness of left hip, not elsewhere classified  Muscle weakness  (generalized)     Problem List Patient Active Problem List   Diagnosis Date Noted  . Paresthesia of arm 06/27/2020  . Chronic bilateral low back pain without sciatica 06/27/2020  . Daytime somnolence 06/27/2020  . Microscopic hematuria 06/27/2020  . Elevated blood-pressure reading without diagnosis of hypertension 06/27/2020  . Weakness 06/06/2020  . Dizziness 06/06/2020  . Nausea 06/06/2020  . Paresthesia 06/06/2020  . Fatigue 06/06/2020  . Physical deconditioning 06/06/2020  . BMI 37.0-37.9, adult 06/06/2020  . Impaired fasting blood sugar 04/11/2020  . Furuncle 04/11/2020  . Chest discomfort 04/11/2020  . Palpitations 04/11/2020  . Loose stools 03/23/2020  . Colitis 03/23/2020  . Abdominal pain, epigastric 03/23/2020  . Early satiety 03/23/2020  . Flatulence 12/21/2019  . Bloating 12/21/2019  . Chest pain 12/09/2019  . Hyperglycemia 12/09/2019  . Gastroesophageal reflux disease 12/09/2019  . 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 Abnormal EKG 11/27/2018  . Palpitation 11/27/2018  PHYSICAL THERAPY DISCHARGE SUMMARY  Visits from Start of Care: 3  Current functional level related to goals / functional outcomes: Patient did not return for further therapy after ;ast session 08/05/20   Remaining deficits: Status unknown   Education / Equipment: HEP Plan: Patient agrees to discharge.  Patient goals were not met. Patient is being discharged due to not returning since the last visit.  ?????           Beaulah Dinning, PT, DPT 09/22/20 1:27 PM       [place] [facility] [address] [place], [place], [address] Phone: [phone]   Fax:  [phone]  Name: Timothy Barry MRN: [id number] Date of Birth: May 19, 1991

## 2020-10-05 ENCOUNTER — Ambulatory Visit (INDEPENDENT_AMBULATORY_CARE_PROVIDER_SITE_OTHER): Payer: 59 | Admitting: Medical

## 2020-10-05 ENCOUNTER — Encounter: Payer: Self-pay | Admitting: Medical

## 2020-10-05 ENCOUNTER — Other Ambulatory Visit: Payer: Self-pay

## 2020-10-05 VITALS — BP 130/80 | HR 77 | Ht 71.0 in | Wt 265.0 lb

## 2020-10-05 DIAGNOSIS — M549 Dorsalgia, unspecified: Secondary | ICD-10-CM | POA: Insufficient documentation

## 2020-10-05 DIAGNOSIS — G8929 Other chronic pain: Secondary | ICD-10-CM | POA: Diagnosis not present

## 2020-10-05 DIAGNOSIS — R0789 Other chest pain: Secondary | ICD-10-CM | POA: Diagnosis not present

## 2020-10-05 MED ORDER — MELOXICAM 7.5 MG PO TABS: 7.5000 mg | ORAL_TABLET | Freq: Every day | ORAL | 0 refills | Status: DC

## 2020-10-05 MED ORDER — GABAPENTIN 100 MG PO CAPS: 100.0000 mg | ORAL_CAPSULE | Freq: Every day | ORAL | 1 refills | Status: DC

## 2020-10-05 NOTE — Patient Instructions (Signed)
Recommendations:  Begin Meloxicam medication 1 tablet daily for pain/inflammation of chest wall  Begin Gabapentin 100mg  at bedtime for pain  Lets get you back in with therapy for chest wall pain  Stretch daily head to toe  Use arm rotational stretching, neck and back stretching  You have had NUMEROUS tests that show NO WORRISOME cause of chest pain related to lungs, heart or gastric system  I strongly believe you chest pains is musculoskeletal in nature, not life threatening

## 2020-10-05 NOTE — Progress Notes (Signed)
Subjective: Chief Complaint  Patient presents with  . Chest Pain    x1 month in the middle of chest    Here for recheck on chest pain, chronic for months.  Comes and goes regular.  Called EMS yesterday, they advised he use aspirin which he did.  After 5-10 minutes felt improved.  Feels a sound like grinding or swooshing in his chest when on treadmill with arms swinging.  Breathing good, but feels uncomfortable in chest.   He has been here several times for the same complaint.  He notes pain in his chest centrally with motion of the arms and chest.  At the gym he has pain when he does any type of chest exercises.  He denies shortness of breath, numbness, tingling, swelling, weakness.  No lightheadedness or dizziness.  No abdominal pain.  No nausea or vomiting.  Otherwise normal state of health.  No other aggravating or relieving factors.    No other c/o.  Past Medical History:  Diagnosis Date  . Abnormal EKG 11/27/2018  . GERD (gastroesophageal reflux disease)   . HTN (hypertension)   . Palpitation 11/27/2018   Family History  Problem Relation Age of Onset  . Esophageal cancer Father   . Heart disease Maternal Grandmother 29       MI  . Stomach cancer Paternal Grandfather   . Stomach cancer Paternal Aunt      The following portions of the patient's history were reviewed and updated as appropriate: allergies, current medications, past family history, past medical history, past social history, past surgical history and problem list.  ROS Otherwise as in subjective above    Objective: BP 130/80   Pulse 77   Ht 5\' 11"  (1.803 m)   Wt 265 lb (120.2 kg)   SpO2 97%   BMI 36.96 kg/m   Wt Readings from Last 3 Encounters:  10/05/20 265 lb (120.2 kg)  08/16/20 267 lb 12.8 oz (121.5 kg)  07/05/20 271 lb 3.2 oz (123 kg)   BP Readings from Last 3 Encounters:  10/05/20 130/80  08/16/20 124/86  07/05/20 132/74    General appearance: alert, no distress, well developed, well  nourished Neck: supple, no lymphadenopathy, no thyromegaly, no masses, no JVD, no bruits Heart: RRR, normal S1, S2, no murmurs Lungs: CTA bilaterally, no wheezes, rhonchi, or rales Chest wall -tender over costochondral region of the sternum bilaterally, tenderness in the same area with arm range of motion and resisted chest flexion Back nontender, relatively normal range of motion Abdomen: +bs, soft, non tender, non distended, no masses, no hepatomegaly, no splenomegaly Pulses: 2+ radial pulses, 2+ pedal pulses, normal cap refill Ext: no edema MSK: nontender arms and legs      Assessment: Encounter Diagnoses  Name Primary?  . Chest wall pain Yes  . Chronic back pain, unspecified back location, unspecified back pain laterality       Plan: We have seen him numerous times at this office for the same complaint.  He has had several evaluations and seeing several specialist for his variety of complaints.  Several things have been ruled out.    I printed out for him all the recent imaging he has been in the past year and labs.  We discussed those findings which are basically all negative or normal.  The primary etiology appears to be chest wall pain, musculoskeletal pain.  Begin medication as below.  Referral back to physical therapy to help address this per specific pain along with his ongoing  back pain  I suspect there is a component of anxiety, also he is deconditioned.  He denies the anxiety component.  He has recently started going to the gym and has been exercising and stretching some at my recommendation  I reviewed his recent cardiology notes and recent echocardiogram  I reviewed his recent gastroenterology note.    We reviewed back over prior visits and medications.  He is not currently taking a beta-blocker, no particular complaint of palpitations currently.  He is also not taking medication sent prior for mood and anxiety.  I sent a message to the physical therapist he saw  prior to get him back in for chest wall pain, chronic  Timothy Barry was seen today for chest pain.  Diagnoses and all orders for this visit:  Chest wall pain  Chronic back pain, unspecified back location, unspecified back pain laterality  Other orders -     gabapentin (NEURONTIN) 100 MG capsule; Take 1 capsule (100 mg total) by mouth at bedtime. -     meloxicam (MOBIC) 7.5 MG tablet; Take 1 tablet (7.5 mg total) by mouth daily.    Follow up: prn

## 2020-10-06 ENCOUNTER — Encounter: Payer: Self-pay | Admitting: Medical

## 2020-10-07 ENCOUNTER — Other Ambulatory Visit: Payer: Self-pay

## 2020-10-07 DIAGNOSIS — R0789 Other chest pain: Secondary | ICD-10-CM

## 2020-10-07 DIAGNOSIS — G8929 Other chronic pain: Secondary | ICD-10-CM

## 2020-10-27 ENCOUNTER — Other Ambulatory Visit: Payer: Self-pay | Admitting: Medical

## 2020-11-11 ENCOUNTER — Encounter: Payer: Self-pay | Admitting: Medical

## 2021-01-27 ENCOUNTER — Telehealth: Payer: Self-pay | Admitting: Medical

## 2021-01-27 ENCOUNTER — Other Ambulatory Visit: Payer: Self-pay | Admitting: Medical

## 2021-01-27 MED ORDER — PANTOPRAZOLE SODIUM 40 MG PO TBEC: 40.0000 mg | DELAYED_RELEASE_TABLET | Freq: Every day | ORAL | 1 refills | Status: DC

## 2021-01-27 NOTE — Telephone Encounter (Signed)
Pt needs refill on Pantoprazole. He wants it sent to the [facility]

## 2021-01-31 ENCOUNTER — Other Ambulatory Visit: Payer: Self-pay

## 2021-01-31 ENCOUNTER — Telehealth: Payer: 59 | Admitting: Medical

## 2021-01-31 DIAGNOSIS — Z91199 Patient's noncompliance with other medical treatment and regimen due to unspecified reason: Secondary | ICD-10-CM

## 2021-01-31 DIAGNOSIS — Z5329 Procedure and treatment not carried out because of patient's decision for other reasons: Secondary | ICD-10-CM

## 2021-02-01 NOTE — Progress Notes (Signed)
No show

## 2021-02-13 ENCOUNTER — Other Ambulatory Visit: Payer: Self-pay

## 2021-02-13 ENCOUNTER — Encounter: Payer: Self-pay | Admitting: Medical

## 2021-02-13 ENCOUNTER — Ambulatory Visit (INDEPENDENT_AMBULATORY_CARE_PROVIDER_SITE_OTHER): Payer: 59 | Admitting: Medical

## 2021-02-13 VITALS — BP 122/84 | HR 87 | Ht 71.0 in | Wt 261.0 lb

## 2021-02-13 DIAGNOSIS — R109 Unspecified abdominal pain: Secondary | ICD-10-CM | POA: Diagnosis not present

## 2021-02-13 DIAGNOSIS — R0789 Other chest pain: Secondary | ICD-10-CM

## 2021-02-13 DIAGNOSIS — R0600 Dyspnea, unspecified: Secondary | ICD-10-CM | POA: Diagnosis not present

## 2021-02-13 DIAGNOSIS — R82998 Other abnormal findings in urine: Secondary | ICD-10-CM

## 2021-02-13 LAB — POCT URINALYSIS DIP (PROADVANTAGE DEVICE)
Bilirubin, UA: NEGATIVE
Blood, UA: NEGATIVE
Glucose, UA: NEGATIVE mg/dL
Ketones, POC UA: NEGATIVE mg/dL
Leukocytes, UA: NEGATIVE
Nitrite, UA: NEGATIVE
Protein Ur, POC: NEGATIVE mg/dL
Specific Gravity, Urine: 1.025
Urobilinogen, Ur: 0.2
pH, UA: 6 (ref 5.0–8.0)

## 2021-02-13 MED ORDER — FLUTICASONE FUROATE-VILANTEROL 100-25 MCG/INH IN AEPB: 1.0000 | INHALATION_SPRAY | Freq: Every day | RESPIRATORY_TRACT | 0 refills | Status: DC

## 2021-02-13 NOTE — Progress Notes (Signed)
Subjective: Chief Complaint  Patient presents with  . Chest Pain    Mid chest pain    Here for ongoing pains in chest , from mid chest to roughly at top of abdomen.   Feels weak, feels heart beat fast at times.  Feels that he can't take deep breath at times.  Pain happens typically 30-60 minutes after eating, sometimes 5 minutes after eating, sometimes hours after eating.  No recent injury, no fall.  No recent strenuous activity.  Currently working as a [employer], no heavy lifting or pulling. Still taking Protonix without relief.    Having trouble holding urine.   Feels urge to urine, can only hold 2-3 minute.  Drinking at least 8-10 bottles of water daily. Gets foamy urine often like if someone poured soda in the bowl. No blood, no burning, no odor in urine.    Nonsmoker, no alcohol.  Fasts during the day somewhat often  No other aggravating or relieving factors.    No other c/o.  Past Medical History:  Diagnosis Date  . Abnormal EKG 11/27/2018  . GERD (gastroesophageal reflux disease)   . HTN (hypertension)   . Palpitation 11/27/2018   Family History  Problem Relation Age of Onset  . Esophageal cancer Father   . Heart disease Maternal Grandmother 29       MI  . Stomach cancer Paternal Grandfather   . Stomach cancer Paternal Aunt      The following portions of the patient's history were reviewed and updated as appropriate: allergies, current medications, past family history, past medical history, past social history, past surgical history and problem list.  ROS Otherwise as in subjective above    Objective: BP 122/84   Pulse 87   Ht 5\' 11"  (1.803 m)   Wt 261 lb (118.4 kg)   SpO2 97%   BMI 36.40 kg/m   Wt Readings from Last 3 Encounters:  02/13/21 261 lb (118.4 kg)  10/05/20 265 lb (120.2 kg)  08/16/20 267 lb 12.8 oz (121.5 kg)   BP Readings from Last 3 Encounters:  02/13/21 122/84  10/05/20 130/80  08/16/20 124/86    General appearance: alert, no  distress, well developed, well nourished Neck: supple, no lymphadenopathy, no thyromegaly, no masses, no JVD, no bruits Heart: RRR, normal S1, S2, no murmurs Lungs: CTA bilaterally, no wheezes, rhonchi, or rales Chest wall - nontender, no deformity, normal I:E Back nontender, relatively normal range of motion Abdomen: +bs, soft, non tender, non distended, no masses, no hepatomegaly, no splenomegaly Pulses: 2+ radial pulses, 2+ pedal pulses, normal cap refill Ext: no edema MSK: nontender arms and legs    Assessment: Encounter Diagnoses  Name Primary?  . Chest discomfort Yes  . Abdominal discomfort   . Dyspnea, unspecified type   . Foamy urine       Plan: Chest and abdominal discomfort.   Still intermittent ongoing issue.  Consider HIDA scan.  Consider recheck with GI Doubt cardiac issues.  PFT reviewed, abnormal.  Begin trial of Breo, call report 2 wk  Foamy urine - UA reviewed.  Reviewed labs from 06/2020.   Consider urology consult.   We have seen him numerous times at this office for the same complaints.  Regarding his variety of concerns ,he has had several evaluations and seeing several specialist for his variety of complaints.  Several things have been ruled out.    I reviewed his prior cardiology notes and recent echocardiogram  I reviewed his prior gastroenterology note.  Timothy Barry was seen today for chest pain.  Diagnoses and all orders for this visit:  Chest discomfort -     Spirometry with Graph -     NM Hepato W/Eject Fract; Future  Abdominal discomfort -     NM Hepato W/Eject Fract; Future  Dyspnea, unspecified type  Foamy urine -     POCT Urinalysis DIP (Proadvantage Device) -     Ambulatory referral to Urology  Other orders -     fluticasone furoate-vilanterol (BREO ELLIPTA) 100-25 MCG/INH AEPB; Inhale 1 puff into the lungs daily.   Follow up: pending call back

## 2021-02-14 NOTE — Progress Notes (Signed)
Done

## 2021-02-16 ENCOUNTER — Telehealth: Payer: Self-pay

## 2021-02-16 NOTE — Telephone Encounter (Signed)
Pt called to inquire about refills on pantoprazole. Pt informed that medication is at the pharmacy.

## 2021-03-18 ENCOUNTER — Telehealth: Payer: Self-pay

## 2021-03-18 NOTE — Telephone Encounter (Signed)
P.A. PANTOPRAZOLE  

## 2021-03-24 NOTE — Telephone Encounter (Signed)
P.A. approved til 03/20/22, sent my chart message

## 2021-04-18 NOTE — Progress Notes (Signed)
Cardiology Clinic Note   Patient Name: Timothy Barry Date of Encounter: 04/20/2021  Primary Care Provider:  Jac Canavan, PA-C Primary Cardiologist:  Olga Millers, MD  Patient Profile    Timothy Barry 30 year old male presents the clinic today for follow-up evaluation of his precordial pain and palpitations.  Past Medical History    Past Medical History:  Diagnosis Date   Abnormal EKG 11/27/2018   GERD (gastroesophageal reflux disease)    HTN (hypertension)    Palpitation 11/27/2018   Past Surgical History:  Procedure Laterality Date   No prior surgery      Allergies  No Known Allergies  History of Present Illness    Timothy Barry has a PMH of abnormal EKG, GERD, hypertension, precordial pain, and palpitations.  His echocardiogram 1/21 showed normal LVEF, mild left atrial enlargement.  Cardiac event monitor 2/21 showed sinus rhythm with occasional PVCs and PACs.  He presented to the emergency room 8/21 with flank pain.  Abdominal CT showed no acute abnormal findings in his abdomen or pelvis.  Chest x-ray was negative.  His EKG showed sinus rhythm, right axis deviation and diffuse ST elevation suggesting pericarditis.  ST changes similar to his previous EKG were noted.  He was given a course of nonsteroidals.  He was seen by Dr. Jens Som on 07/05/2020.  During that time he continued to have chest pain.  He reported it was intermittent.  His pain was not related to food/meals.  It was present with activities and with laying down.  He denied relieving factors and associated symptoms.  He denied dyspnea and syncope.  He denied increased heart rate unless he was experiencing pain.  Repeat echocardiogram showed normal LVEF and no pericardial effusion.  It was felt his chest discomfort was from noncardiac causes.  He presents the clinic today for follow-up evaluation states he has been following a high-fiber diet and exercising 4 times per week.  He reports that he walks 40  minutes 4 times per week.  He reports he is lost around 50 pounds.  He continues to notice occasional intermittent episodes of palpitations.  He reports that they last for seconds and then dissipate on their own without intervention.  He does describe some chest discomfort that is also occasional and not exertional.  He reports that after he takes a deep breath the pain dissipates.  We reviewed his previous echocardiogram and he expressed understanding.  He does not have recent cholesterol or A1c.  I will order a fasting lipid panel and A1c, given a salty 6 diet sheet, give him a blood pressure log, have him maintain his physical activity and follow-up in 12 months.  He is a [employer].  Today he denies chest pain, shortness of breath, lower extremity edema, fatigue, palpitations, melena, hematuria, hemoptysis, diaphoresis, weakness, presyncope, syncope, orthopnea, and PND.   Home Medications    Prior to Admission medications   Medication Sig Start Date End Date Taking? Authorizing Provider  fluticasone furoate-vilanterol (BREO ELLIPTA) 100-25 MCG/INH AEPB Inhale 1 puff into the lungs daily. 02/13/21   Tysinger, Kermit Balo, PA-C  gabapentin (NEURONTIN) 100 MG capsule Take 1 capsule (100 mg total) by mouth at bedtime. Patient not taking: Reported on 02/13/2021 10/05/20   Tysinger, Kermit Balo, PA-C  meloxicam (MOBIC) 7.5 MG tablet TAKE 1 TABLET BY MOUTH EVERY DAY Patient not taking: Reported on 02/13/2021 10/27/20   Tysinger, Kermit Balo, PA-C  pantoprazole (PROTONIX) 40 MG tablet Take 1 tablet (40 mg total) by mouth  daily. Patient not taking: Reported on 02/13/2021 01/27/21   Tysinger, Kermit Balo, PA-C  propranolol (INDERAL) 10 MG tablet Take 1 tablet (10 mg total) by mouth 2 (two) times daily. Patient not taking: No sig reported 07/15/20   Tysinger, Kermit Balo, PA-C    Family History    Family History  Problem Relation Age of Onset   Esophageal cancer Father    Heart disease Maternal Grandmother 67       MI    Stomach cancer Paternal Grandfather    Stomach cancer Paternal Aunt    He indicated that his mother is alive. He indicated that his father is deceased. He indicated that the status of his maternal grandmother is unknown. He indicated that the status of his paternal grandfather is unknown. He indicated that the status of his paternal aunt is unknown.  Social History    Social History   Socioeconomic History   Marital status: Single    Spouse name: Not on file   Number of children: 0   Years of education: Not on file   Highest education level: Not on file  Occupational History   Not on file  Tobacco Use   Smoking status: Former    Years: 2.00    Pack years: 0.00    Types: Cigarettes    Quit date: 02/09/2016    Years since quitting: 5.1   Smokeless tobacco: Never   Tobacco comments:    Hookia daily 1 x  Vaping Use   Vaping Use: Never used  Substance and Sexual Activity   Alcohol use: Never   Drug use: Never   Sexual activity: Not on file  Other Topics Concern   Not on file  Social History Narrative   Not on file   Social Determinants of Health   Financial Resource Strain: Not on file  Food Insecurity: Not on file  Transportation Needs: Not on file  Physical Activity: Not on file  Stress: Not on file  Social Connections: Not on file  Intimate Partner Violence: Not on file     Review of Systems    General:  No chills, fever, night sweats or weight changes.  Cardiovascular:  No chest pain, dyspnea on exertion, edema, orthopnea, palpitations, paroxysmal nocturnal dyspnea. Dermatological: No rash, lesions/masses Respiratory: No cough, dyspnea Urologic: No hematuria, dysuria Abdominal:   No nausea, vomiting, diarrhea, bright red blood per rectum, melena, or hematemesis Neurologic:  No visual changes, wkns, changes in mental status. All other systems reviewed and are otherwise negative except as noted above.  Physical Exam    VS:  BP 134/82 (BP Location: Left Arm,  Patient Position: Sitting, Cuff Size: Normal)   Pulse 80   Ht 5\' 11"  (1.803 m)   Wt 253 lb 3.2 oz (114.9 kg)   SpO2 95%   BMI 35.31 kg/m  , BMI Body mass index is 35.31 kg/m. GEN: Well nourished, well developed, in no acute distress. HEENT: normal. Neck: Supple, no JVD, carotid bruits, or masses. Cardiac: RRR, no murmurs, rubs, or gallops. No clubbing, cyanosis, edema.  Radials/DP/PT 2+ and equal bilaterally.  Respiratory:  Respirations regular and unlabored, clear to auscultation bilaterally. GI: Soft, nontender, nondistended, BS + x 4. MS: no deformity or atrophy. Skin: warm and dry, no rash. Neuro:  Strength and sensation are intact. Psych: Normal affect.  Accessory Clinical Findings    Recent Labs: 06/29/2020: ALT 16; BUN 12; Creatinine, Ser 0.87; Hemoglobin 15.2; Platelets 407; Potassium 4.4; Sodium 137   Recent Lipid  Panel No results found for: CHOL, TRIG, HDL, CHOLHDL, VLDL, LDLCALC, LDLDIRECT  ECG personally reviewed by me today-none today.  Echocardiogram 07/07/2020 IMPRESSIONS     1. Left ventricular ejection fraction, by estimation, is 60 to 65%. The  left ventricle has normal function. The left ventricle has no regional  wall motion abnormalities. There is mild concentric left ventricular  hypertrophy. Left ventricular diastolic  parameters were normal. The average left ventricular global longitudinal  strain is -16.3 %.   2. Right ventricular systolic function is normal. The right ventricular  size is normal. There is normal pulmonary artery systolic pressure. The  estimated right ventricular systolic pressure is 23.8 mmHg.   3. The mitral valve is normal in structure. Trivial mitral valve  regurgitation. No evidence of mitral stenosis.   4. The aortic valve is normal in structure. Aortic valve regurgitation is  not visualized. No aortic stenosis is present.   5. The inferior vena cava is normal in size with greater than 50%  respiratory variability,  suggesting right atrial pressure of 3 mmHg.    Assessment & Plan   1.  Palpitations-reports occasional brief episodes of palpitations.  Cardiac event monitor showed normal sinus rhythm with occasional PACs and PVCs. Avoid triggers caffeine, chocolate, EtOH, dehydration etc. Heart healthy low-sodium diet Increase physical activity as tolerated  Chest pain-occasional brief episodes of chest discomfort.  Follow-up echocardiogram showed no pericardial effusion and normal EF.  Appears to be precordial in nature.  No exertional chest discomfort. Continue to monitor. Fasting lipids  Essential hypertension-BP today 131/82.  Well-controlled at home. Continue to monitor Heart healthy low-sodium diet-salty 6 given Increase physical activity as tolerated Maintain blood pressure log  Type 2 diabetes-hemoglobin A1c 5.8 on 04/11/2020 Order A1c Heart healthy low-sodium carb modified diet Continue weight loss  Disposition: Follow-up with Dr. Jens Som in 1 year.  Thomasene Ripple. Timothy Blackwelder NP-C    04/20/2021, 10:03 AM [facility] [address] Office [phone] Fax [phone]  Notice: This dictation was prepared with Dragon dictation along with smaller phrase technology. Any transcriptional errors that result from this process are unintentional and may not be corrected upon review.  I spent 15 minutes examining this patient, reviewing medications, and using patient centered shared decision making involving her cardiac care.  Prior to her visit I spent greater than 20 minutes reviewing her past medical history,  medications, and prior cardiac tests.

## 2021-04-19 ENCOUNTER — Telehealth: Payer: Self-pay | Admitting: Internal Medicine

## 2021-04-19 NOTE — Telephone Encounter (Signed)
[facility] doesn't do NM Hepato w/ E. Please call hospital to schedule this

## 2021-04-19 NOTE — Telephone Encounter (Signed)
Referral has been changed to [facility]

## 2021-04-20 ENCOUNTER — Encounter: Payer: Self-pay | Admitting: General Practice

## 2021-04-20 ENCOUNTER — Ambulatory Visit (INDEPENDENT_AMBULATORY_CARE_PROVIDER_SITE_OTHER): Payer: 59 | Admitting: General Practice

## 2021-04-20 ENCOUNTER — Other Ambulatory Visit: Payer: Self-pay

## 2021-04-20 VITALS — BP 134/82 | HR 80 | Ht 71.0 in | Wt 253.2 lb

## 2021-04-20 DIAGNOSIS — R072 Precordial pain: Secondary | ICD-10-CM | POA: Diagnosis not present

## 2021-04-20 DIAGNOSIS — I10 Essential (primary) hypertension: Secondary | ICD-10-CM

## 2021-04-20 DIAGNOSIS — Z79899 Other long term (current) drug therapy: Secondary | ICD-10-CM

## 2021-04-20 DIAGNOSIS — R002 Palpitations: Secondary | ICD-10-CM

## 2021-04-20 MED ORDER — ATORVASTATIN CALCIUM 80 MG PO TABS: 80.0000 mg | ORAL_TABLET | Freq: Every day | ORAL | 3 refills | Status: DC

## 2021-04-20 NOTE — Patient Instructions (Signed)
Medication Instructions:  The current medical regimen is effective;  continue present plan and medications as directed. Please refer to the Current Medication list given to you today.  *If you need a refill on your cardiac medications before your next appointment, please call your pharmacy*  Lab Work: FASTING LIPID AND LFT If you have labs (blood work) drawn today and your tests are completely normal, you will receive your results only by:  MyChart Message (if you have MyChart) OR A paper copy in the mail.  If you have any lab test that is abnormal or we need to change your treatment, we will call you to review the results. You may go to any [facility] that is convenient for you however, we do have a lab in our office that is able to assist you. You DO NOT need an appointment for our lab. The lab is open 8:00am and closes at 4:00pm. Lunch 12:45 - 1:45pm.  Special Instructions TAKE AND LOG YOUR BLOOD PRESSURE   PLEASE READ AND FOLLOW SALTY 6-ATTACHED-1,800 mg daily  PLEASE MAINTAIN PHYSICAL ACTIVITY AS TOLERATED  Follow-Up: Your next appointment:  12 month(s) In Person with You may see Olga Millers, MD or one of the following Advanced Practice Providers on your designated Care Team:  Herbie Drape, FNP-C Marjie Skiff, PA-C  Please call our office 2 months in advance to schedule this appointment   At [facility], you and your health needs are our priority.  As part of our continuing mission to provide you with exceptional heart care, we have created designated Provider Care Teams.  These Care Teams include your primary Cardiologist (physician) and Advanced Practice Providers (APPs -  Physician Assistants and Nurse Practitioners) who all work together to provide you with the care you need, when you need it.

## 2021-04-21 LAB — LIPID PANEL
Chol/HDL Ratio: 3.6 ratio (ref 0.0–5.0)
Cholesterol, Total: 154 mg/dL (ref 100–199)
HDL: 43 mg/dL (ref >39)
LDL Chol Calc (NIH): 101 mg/dL — ABNORMAL HIGH (ref 0–99)
Triglycerides: 48 mg/dL (ref 0–149)
VLDL Cholesterol Cal: 10 mg/dL (ref 5–40)

## 2021-04-22 LAB — HEMOGLOBIN A1C
Est. average glucose Bld gHb Est-mCnc: 117 mg/dL
Hgb A1c MFr Bld: 5.7 % — ABNORMAL HIGH (ref 4.8–5.6)

## 2021-05-03 ENCOUNTER — Other Ambulatory Visit (INDEPENDENT_AMBULATORY_CARE_PROVIDER_SITE_OTHER): Payer: 59

## 2021-05-03 ENCOUNTER — Ambulatory Visit (INDEPENDENT_AMBULATORY_CARE_PROVIDER_SITE_OTHER): Payer: 59 | Admitting: Gastroenterology

## 2021-05-03 ENCOUNTER — Encounter: Payer: Self-pay | Admitting: Gastroenterology

## 2021-05-03 VITALS — BP 90/64 | HR 77 | Ht 71.0 in | Wt 253.0 lb

## 2021-05-03 DIAGNOSIS — R1013 Epigastric pain: Secondary | ICD-10-CM

## 2021-05-03 DIAGNOSIS — K5282 Eosinophilic colitis: Secondary | ICD-10-CM | POA: Diagnosis not present

## 2021-05-03 DIAGNOSIS — K219 Gastro-esophageal reflux disease without esophagitis: Secondary | ICD-10-CM

## 2021-05-03 DIAGNOSIS — K2100 Gastro-esophageal reflux disease with esophagitis, without bleeding: Secondary | ICD-10-CM | POA: Diagnosis not present

## 2021-05-03 LAB — CBC WITH DIFFERENTIAL/PLATELET
Basophils Absolute: 0 10*3/uL (ref 0.0–0.1)
Basophils Relative: 0.5 % (ref 0.0–3.0)
Eosinophils Absolute: 1 10*3/uL — ABNORMAL HIGH (ref 0.0–0.7)
Eosinophils Relative: 14.3 % — ABNORMAL HIGH (ref 0.0–5.0)
HCT: 46.7 % (ref 39.0–52.0)
Hemoglobin: 15.7 g/dL (ref 13.0–17.0)
Lymphocytes Relative: 42 % (ref 12.0–46.0)
Lymphs Abs: 3 10*3/uL (ref 0.7–4.0)
MCHC: 33.6 g/dL (ref 30.0–36.0)
MCV: 84.1 fl (ref 78.0–100.0)
Monocytes Absolute: 0.7 10*3/uL (ref 0.1–1.0)
Monocytes Relative: 9.4 % (ref 3.0–12.0)
Neutro Abs: 2.4 10*3/uL (ref 1.4–7.7)
Neutrophils Relative %: 33.8 % — ABNORMAL LOW (ref 43.0–77.0)
Platelets: 328 10*3/uL (ref 150.0–400.0)
RBC: 5.55 Mil/uL (ref 4.22–5.81)
RDW: 14 % (ref 11.5–15.5)
WBC: 7.1 10*3/uL (ref 4.0–10.5)

## 2021-05-03 MED ORDER — PANTOPRAZOLE SODIUM 40 MG PO TBEC: 40.0000 mg | DELAYED_RELEASE_TABLET | Freq: Two times a day (BID) | ORAL | 0 refills | Status: DC

## 2021-05-03 NOTE — Patient Instructions (Addendum)
It was my pleasure to provide care to you you today. Based on our discussion, I am providing you with my recommendations below:  RECOMMENDATION(S):   We have sent the following medication(s) to your pharmacy:  Pantoprazole - please take 40mg  2 times daily x 10 weeks, then stop  NOTE: If your medication(s) requires a PRIOR AUTHORIZATION, we will receive notification from your pharmacy. Once received, the process to submit for approval may take up to 7-10 business days. You will be contacted about any denials we have received from your insurance company as well as alternatives recommended by your provider.  LABS:   Please proceed to the basement level for lab work before leaving today. Press "B" on the elevator. The lab is located at the first door on the left as you exit the elevator.  HEALTHCARE LAWS AND MY CHART RESULTS:   Due to recent changes in healthcare laws, you may see results of your imaging and/or laboratory studies on MyChart before I have had a chance to review them.  I understand that in some cases there may be results that are confusing or concerning to you. Please understand that not all results are received at same time and often I may need to interpret multiple results in order to provide you with the best plan of care or course of treatment. Therefore, I ask that you please give me 48 hours to thoroughly review all your results before contacting my office for clarification.   FOLLOW UP:  I would like for you to follow up with me as needed.   BMI:  If you are age 31 or younger, your body mass index should be between 19-25. Your There is no height or weight on file to calculate BMI. If this is out of the aformentioned range listed, please consider follow up with your Primary Care Provider.   MY CHART:  The [place] [facility] providers would like to encourage you to use [facility] to communicate with providers for non-urgent requests or questions.  Due to long hold times on the  telephone, sending your provider a message by [facility] may be a faster and more efficient way to get a response.  Please allow 31 business hours for a response.  Please remember that this is for non-urgent requests.   Thank you for trusting me with your gastrointestinal care!    [facility] MD, MPH

## 2021-05-03 NOTE — Progress Notes (Signed)
Referring Provider: Jac Barry, Timothy Barry Primary Care Physician:  Timothy Barry, Timothy Barry  Chief complaint: Epigastric pain   IMPRESSION:  Suspected abdominal migraines: Would consider triptan therapy. Will ask PA Timothy Barry for his input.  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 PPI BID x 10 weeks. Reviewed reflux lifestyle modifications.  Eosinophils on colon biopsies without lower GI symptoms: Eosinophils not seen in other locations on biopsies obtained during EGD and colonoscopy. GI pathogen panel and O&P panel were negative. Reviewed with pathologist, no obvious IBD. Clinical significant is unclear.   Borderline low blood pressure: He will review with PA Timothy Barry  PLAN: - CBC with differential to screen for peripheral eosinophilia - Pantoprazole 40 mg BID x 10 weeks - Discuss triptan therapy with PA Timothy Barry for headaches and ? Abdominal migraines - Consider repeat colonoscopy with additional biopsies for eosinophils with new symptoms   Please see the "Patient Instructions" section for addition details about the plan.  HPI: Timothy Barry is a 30 y.o. male who returns in follow-up for further evaluation of epigastric pain and nausea. Concurrent headache, dizziness, and right ar numbness when the symptoms are severe. 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  Evaluation has included: - CT of the abdomen pelvis with contrast 11/20/2019 normal.  No etiology of pain identified. - Abdominal ultrasound 11/2019 normal - Gastric emptying scan 04/13/2020 normal - Endoscopic evaluation 03/17/2020 showed chronic inactive gastritis.  Esophageal biopsies were normal.  There were no eosinophils on gastric biopsies.  Duodenal biopsies were normal.  Colonoscopy showed scattered erythema., focal active colitis with an increase in eosinophils in the left colon.  Findings are atypical for inflammatory bowel disease.  There was thought to be a parasitic  infection.   - GI pathogen panel 03/23/2020 was normal.  - O&P exam negative 03/24/2020 - ENT consultation with Timothy Barry 07/29/2020 for the sensation of things getting stuck in his throat in the area of the middle of his neck above the suprasternal notch.  Feels that his throat is dry.  Symptoms persist despite pantoprazole 40 mg every morning.  He quit smoking 2 years ago.  Fiberoptic laryngoscopy showed moderate edema of the arytenoid mucosa.  He was diagnosed with laryngeal pharyngeal reflux.  Timothy Barry switched his pantoprazole to dinnertime.  The patient returns in follow-up.  He reports ongoing epigastric pain with intermittent nausea that is followed by a headache. He is no longer taking pantoprazole.  Mother with esophageal cancer.  Paternal grandfather with stomach cancer.   Past Medical History:  Diagnosis Date   Abnormal EKG 11/27/2018   GERD (gastroesophageal reflux disease)    HTN (hypertension)    Palpitation 11/27/2018    Past Surgical History:  Procedure Laterality Date   No prior surgery      Current Outpatient Medications  Medication Sig Dispense Refill   pantoprazole (PROTONIX) 40 MG tablet Take 1 tablet (40 mg total) by mouth 2 (two) times daily. 140 tablet 0   No current facility-administered medications for this visit.    Allergies as of 05/03/2021   (No Known Allergies)    Family History  Problem Relation Age of Onset   Esophageal cancer Father    Heart disease Maternal Grandmother 23       MI   Stomach cancer Paternal Grandfather    Stomach cancer Paternal Aunt       Physical Exam: General:   Alert,  well-nourished, pleasant and cooperative in NAD  Head:  Normocephalic and atraumatic. Eyes:  Sclera clear, no icterus.   Conjunctiva pink. Ears:  Normal auditory acuity. Nose:  No deformity, discharge,  or lesions. Mouth:  No deformity or lesions.   Neck:  Supple; no masses or thyromegaly. Lungs:  Clear throughout to auscultation.   No  wheezes. Heart:  Regular rate and rhythm; no murmurs. Abdomen:  Soft,nontender, nondistended, normal bowel sounds, no rebound or guarding. No hepatosplenomegaly.   Rectal:  Deferred  Msk:  Symmetrical. No boney deformities LAD: No inguinal or umbilical LAD Extremities:  No clubbing or edema. Neurologic:  Alert and  oriented x4;  grossly nonfocal Skin:  Intact without significant lesions or rashes. Psych:  Alert and cooperative. Normal mood and affect.    Timothy Barry L. Orvan Falconer, MD, MPH 05/08/2021, 6:06 PM

## 2021-05-08 ENCOUNTER — Encounter: Payer: Self-pay | Admitting: Gastroenterology

## 2021-05-09 ENCOUNTER — Other Ambulatory Visit: Payer: Self-pay

## 2021-05-09 DIAGNOSIS — D7219 Other eosinophilia: Secondary | ICD-10-CM

## 2021-05-10 ENCOUNTER — Telehealth: Payer: Self-pay | Admitting: Physician Assistant

## 2021-05-10 NOTE — Telephone Encounter (Signed)
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 Mr. Timothy Barry returned my cal and has been scheduled to see Cassie on 7/11 at 1:30pm. Pt aware to arrive 20 minutes early.

## 2021-05-14 NOTE — Progress Notes (Signed)
[place] [phone]   [phone]  CONSULT NOTE  REFERRING PHYSICIAN: Dr. Tarri Glenn  REASON FOR CONSULTATION:  Eosinophilia   HPI Timothy Barry is a 30 y.o. male with a past medical history significant for reflux, malaria in 1997, and colitis is referred to the clinic for evaluation of peripheral eosinophilia.  The patient was referred by his gastroenterologist, Dr. Tarri Glenn.  The patient has been having ongoing work-up for the chief complaint of epigastric pain which has been occurring since 2019. He localized his discomfort to the LUQ as well as epigastric region. He notes that his pain is random and is not associated with any provoking factors such as eating. He also does not feel that he digests his food well. Regarding the abdominal pain, the current diagnosis is possible abdominal migraine.  He had an EGD performed in May 2021. No eosinophils noted on biopsy performed during EGD. He was placed on a PPI BID for reflux and chronic gastritis. He also had a colonoscopy performed in May 2021 which showed focal areas of erythema in which the pathology showed active chronic non-specific colitis with non-necrotizing granulomas and increase in eosinophils. No clear evidence of IBD.  He had a GI pathogen panel and O&P panel which were negative. The patient also had a CT of the abdomen and pelvis performed in January 2021 which was unremarkable. His most recent lab work performed by gastroenterology on 05/03/21 showed a total white blood cell count normal at 7.1, normal platelet count, normal hemoglobin, and a absolute eosinophil count of 1.0.  The relative eosinophil was 14.3%.  He was subsequently referred to the clinic today for evaluation regarding this finding.  Of note, the patient is also being evaluated by cardiology for the chief complaint of chest pain.  He had an echocardiogram performed in 2021 which was unremarkable.  He also appears that he had a CT  angiogram performed in 2021 which was negative for any abnormalities in the heart or lungs.  Per chart review, it appears that the eosinophilia is new.  Given the from prior CBC's did not show significant eosinophilia except in January 2021 his absolute eosinophil count was 0.8.  The patient is from [place] and moved here 2 years ago. He was last in [place] in December 2021-February 2022.   The patient denies any known allergies.  The patient denies any known history of asthma, although, he states that he had spirometry testing by his primary care provider on 02/13/2021.  He was given an inhaler but the patient states that he did not find this beneficial.  He does report feelings of shortness of breath and palpitations at nighttime.  Unclear if the patient experiences wheezing.  As previously mentioned, the patient does follow with cardiology.  The patient denies any recent signs and symptoms of infection.  The patient denies any particular dietary habits.  He denies eating raw or undercooked fish/meat.  He denies any shellfish intake.  The only medications that the patient takes are Lipitor, Protonix, and Flomax.  The patient denies any similar symptoms in his family members.  The patient lives here by himself and the rest of his family lives in [place].  Overall, the patient reports increased fatigue and decreased exercise tolerance.  The patient denies any history of eczema, itching, urticaria, rashes, or angioedema.  The patient reports intentional weight loss of approximately 46 pounds.  The patient states that he has been trying to lose weight by going to the  gym for 30 minutes 3 times a week.  He has lost this in approximately 3 months.  The patient denies any nausea or vomiting.  He had 2 episodes in the last 2 weeks of having headache and dizziness.  His symptoms last approximately 2 days.  Regarding his family history, the patient has 2 brothers and 5 sisters who are all healthy.  The patient's mother is  living without any known medical problems.  The patient's father passed away in 01/28/2013 due to [place].  The patient works as a Administrator.  He is married and his wife lives in [place].  He does not have any children.  He denies any alcohol, drug, or tobacco use.   HPI  Past Medical History:  Diagnosis Date   Abnormal EKG 11/27/2018   GERD (gastroesophageal reflux disease)    HTN (hypertension)    Palpitation 11/27/2018    Past Surgical History:  Procedure Laterality Date   No prior surgery      Family History  Problem Relation Age of Onset   Esophageal cancer Father    Heart disease Maternal Grandmother 88       MI   Stomach cancer Paternal Grandfather    Stomach cancer Paternal Aunt     Social History Social History   Tobacco Use   Smoking status: Former    Years: 2.00    Pack years: 0.00    Types: Cigarettes    Quit date: 02/09/2016    Years since quitting: 5.2   Smokeless tobacco: Never   Tobacco comments:    [place] daily 1 x  Vaping Use   Vaping Use: Never used  Substance Use Topics   Alcohol use: Never   Drug use: Never    No Known Allergies  Current Outpatient Medications  Medication Sig Dispense Refill   pantoprazole (PROTONIX) 40 MG tablet Take 1 tablet (40 mg total) by mouth 2 (two) times daily. 140 tablet 0   atorvastatin (LIPITOR) 80 MG tablet Take 80 mg by mouth daily.     tamsulosin (FLOMAX) 0.4 MG CAPS capsule Take 0.4 mg by mouth daily.     No current facility-administered medications for this visit.    REVIEW OF SYSTEMS:   Review of Systems  Constitutional: Positive for fatigue and intentional weight loss. Negative for chills and fever HENT: Negative for mouth sores, nosebleeds, sore throat and trouble swallowing.   Eyes: Negative for eye problems and icterus.  Respiratory: Positive for occasional shortness of breath.  Negative for cough, hemoptysis, and wheezing.   Cardiovascular: Positive for occasional chest discomfort.  Negative for leg  swelling.  Gastrointestinal: Positive for intermittent epigastric pain.  Negative for constipation, diarrhea, nausea and vomiting.  Genitourinary: Negative for bladder incontinence, difficulty urinating, dysuria, frequency and hematuria.   Musculoskeletal: Negative for back pain, gait problem, neck pain and neck stiffness.  Skin: Negative for itching and rash.  Neurological: Positive for few episodes of dizziness and headaches.  Negative for extremity weakness, gait problem, light-headedness and seizures.  Hematological: Negative for adenopathy. Does not bruise/bleed easily.  Psychiatric/Behavioral: Negative for confusion, depression and sleep disturbance. The patient is not nervous/anxious.     PHYSICAL EXAMINATION:  Blood pressure (!) 141/88, pulse 74, temperature 98.2 F (36.8 C), temperature source Oral, resp. rate 17, weight 254 lb 14.4 oz (115.6 kg), SpO2 100 %.  ECOG PERFORMANCE STATUS: 1  Physical Exam  Constitutional: Oriented to person, place, and time and well-developed, well-nourished, and in no distress.  HENT:  Head: Normocephalic and atraumatic.  Mouth/Throat: Oropharynx is clear and moist. No oropharyngeal exudate.  Eyes: Conjunctivae are normal. Right eye exhibits no discharge. Left eye exhibits no discharge. No scleral icterus.  Neck: Normal range of motion. Neck supple.  Cardiovascular: Normal rate, regular rhythm, normal heart sounds and intact distal pulses.   Pulmonary/Chest: Effort normal and breath sounds normal. No respiratory distress. No wheezes. No rales.  Abdominal: Soft. Bowel sounds are normal. Exhibits no distension and no mass. There is no tenderness.  Musculoskeletal: Normal range of motion. Exhibits no edema.  Lymphadenopathy:    No cervical adenopathy.  Neurological: Alert and oriented to person, place, and time. Exhibits normal muscle tone. Gait normal. Coordination normal.  Skin: Skin is warm and dry. No rash noted. Not diaphoretic. No erythema. No  pallor.  Psychiatric: Mood, memory and judgment normal.  Vitals reviewed.  LABORATORY DATA: Lab Results  Component Value Date   WBC 8.5 05/15/2021   HGB 14.7 05/15/2021   HCT 44.2 05/15/2021   MCV 85.2 05/15/2021   PLT 310 05/15/2021      Chemistry      Component Value Date/Time   NA 140 05/15/2021 1322   NA 140 06/06/2020 1011   K 4.6 05/15/2021 1322   CL 103 05/15/2021 1322   CO2 29 05/15/2021 1322   BUN 10 05/15/2021 1322   BUN 10 06/06/2020 1011   CREATININE 0.75 05/15/2021 1322      Component Value Date/Time   CALCIUM 9.5 05/15/2021 1322   ALKPHOS 75 05/15/2021 1322   AST 17 05/15/2021 1322   ALT 18 05/15/2021 1322   BILITOT 0.5 05/15/2021 1322       RADIOGRAPHIC STUDIES: No results found.  ASSESSMENT: This is a very pleasant 30 year old male from [place] referred to clinic for eosinophilia.     PLAN: The patient was seen with Dr. Julien Nordmann today.  The patient repeat CBC and CMP performed today.  The patient's absolute eosinophil count is 0.8 today.  The patient's white blood cell count is within normal limits at 8.5.  His hemoglobin is within normal limits at 14.7.  The patient's platelet count was within normal limits at 310 K.   Dr. Julien Nordmann discussed that when we are evaluating eosinophilia, the absolute eosinophil count is the most clinically significant number.  Eosinophilia is not defined by the percentage of eosinophils since the percentage varies based on the total white blood cell count and proportion with other white blood cell lineages.  The patient has mild eosinophilia with a absolute eosinophil count of 0.8 today.  Dr. Julien Nordmann discussed hypereosinophilia is considered when the absolute eosinophil count is greater then 1.5.   Given the mild eosinophilia, Dr. Julien Nordmann does not recommend extensive investigation at this time and recommends continuing on observation with close monitoring with a repeat CBC in 3 months. Of course, the the patient shows  worsening eosinophil count then he may consider the patient for a bone marrow biopsy or aspirate in the future.   In the meantime, Dr. Julien Nordmann recommends that we refer the patient ot the allergy and Asthma center to tease out if he has some allergy/asthma driving his eosinophil count.   We will see the patient back for a follow up and repeat blood work in 3 months.   The patient voices understanding of current disease status and treatment options and is in agreement with the current care plan.  All questions were answered. The patient knows to call the clinic with any problems, questions  or concerns. We can certainly see the patient much sooner if necessary.  Thank you so much for allowing me to participate in the care of [facility] I will continue to follow up the patient with you and assist in his care.   Disclaimer: This note was dictated with voice recognition software. Similar sounding words can inadvertently be transcribed and may not be corrected upon review.   Debbrah Sampedro L Farrie Sann May 15, 2021, 5:42 PM  ADDENDUM: Hematology/Oncology Attending: I had a face-to-face encounter with the patient today.  I reviewed his record, lab and recommended his care plan.  This is a very pleasant 30 years old [place] male has been complaining of gastrointestinal symptoms for several months and he was recently evaluated by Dr. Tarri Glenn with gastroenterology with no clear etiology for his condition.  He was also found on recurrent blood work to have persistent mild eosinophilia of unclear etiology.  The patient also gave a history of asthma and shortness of breath. He has several test for ova and parasites as well as upper endoscopy and colonoscopy that were unremarkable except for a small focus of non necrotizing granuloma lesion in the rectal area with increased eosinophils  The patient was referred to [place] today for evaluation and recommendation regarding his condition. Repeat CBC today  showed no concerning findings except for slightly elevated absolute eosinophil count of 800.  Comprehensive metabolic panel is unremarkable except for mild hyperglycemia with blood glucose of 105. I had a lengthy discussion with the patient today about his condition.  I do not see an urgent need to proceed with any intensive investigation for his mild eosinophilia which does not meet the criteria for absolute eosinophilia. I recommended for the patient to continue on observation and we will see him back for follow-up visit in 3 months for evaluation and repeat blood work. We will also refer the patient to allergy medicine to identify any underlying etiology that could be causing his eosinophilia. His previous blood work for ova and parasite were negative. He was advised to call immediately if he has any other concerning symptoms in the interval.  Disclaimer: This note was dictated with voice recognition software. Similar sounding words can inadvertently be transcribed and may be missed upon review. Eilleen Kempf, MD 05/15/21

## 2021-05-15 ENCOUNTER — Other Ambulatory Visit: Payer: Self-pay | Admitting: Physician Assistant

## 2021-05-15 ENCOUNTER — Inpatient Hospital Stay: Payer: 59 | Attending: Physician Assistant | Admitting: Physician Assistant

## 2021-05-15 ENCOUNTER — Inpatient Hospital Stay: Payer: 59

## 2021-05-15 ENCOUNTER — Other Ambulatory Visit: Payer: Self-pay

## 2021-05-15 ENCOUNTER — Encounter: Payer: Self-pay | Admitting: Physician Assistant

## 2021-05-15 VITALS — BP 141/88 | HR 74 | Temp 98.2°F | Resp 17 | Wt 254.9 lb

## 2021-05-15 DIAGNOSIS — K6289 Other specified diseases of anus and rectum: Secondary | ICD-10-CM | POA: Diagnosis not present

## 2021-05-15 DIAGNOSIS — K219 Gastro-esophageal reflux disease without esophagitis: Secondary | ICD-10-CM

## 2021-05-15 DIAGNOSIS — D721 Eosinophilia, unspecified: Secondary | ICD-10-CM

## 2021-05-15 DIAGNOSIS — K2950 Unspecified chronic gastritis without bleeding: Secondary | ICD-10-CM

## 2021-05-15 DIAGNOSIS — Z87891 Personal history of nicotine dependence: Secondary | ICD-10-CM

## 2021-05-15 DIAGNOSIS — R739 Hyperglycemia, unspecified: Secondary | ICD-10-CM

## 2021-05-15 DIAGNOSIS — Z807 Family history of other malignant neoplasms of lymphoid, hematopoietic and related tissues: Secondary | ICD-10-CM

## 2021-05-15 DIAGNOSIS — J45909 Unspecified asthma, uncomplicated: Secondary | ICD-10-CM

## 2021-05-15 DIAGNOSIS — Z800 Family history of malignant neoplasm of digestive organs: Secondary | ICD-10-CM

## 2021-05-15 LAB — CMP (CANCER CENTER ONLY)
ALT: 18 U/L (ref 0–44)
AST: 17 U/L (ref 15–41)
Albumin: 3.7 g/dL (ref 3.5–5.0)
Alkaline Phosphatase: 75 U/L (ref 38–126)
Anion gap: 8 (ref 5–15)
BUN: 10 mg/dL (ref 6–20)
CO2: 29 mmol/L (ref 22–32)
Calcium: 9.5 mg/dL (ref 8.9–10.3)
Chloride: 103 mmol/L (ref 98–111)
Creatinine: 0.75 mg/dL (ref 0.61–1.24)
GFR, Estimated: >60 mL/min (ref >60)
Glucose, Bld: 105 mg/dL — ABNORMAL HIGH (ref 70–99)
Potassium: 4.6 mmol/L (ref 3.5–5.1)
Sodium: 140 mmol/L (ref 135–145)
Total Bilirubin: 0.5 mg/dL (ref 0.3–1.2)
Total Protein: 7.6 g/dL (ref 6.5–8.1)

## 2021-05-15 LAB — CBC WITH DIFFERENTIAL (CANCER CENTER ONLY)
Abs Immature Granulocytes: 0.01 10*3/uL (ref 0.00–0.07)
Basophils Absolute: 0 10*3/uL (ref 0.0–0.1)
Basophils Relative: 0 %
Eosinophils Absolute: 0.8 10*3/uL — ABNORMAL HIGH (ref 0.0–0.5)
Eosinophils Relative: 9 %
HCT: 44.2 % (ref 39.0–52.0)
Hemoglobin: 14.7 g/dL (ref 13.0–17.0)
Immature Granulocytes: 0 %
Lymphocytes Relative: 43 %
Lymphs Abs: 3.6 10*3/uL (ref 0.7–4.0)
MCH: 28.3 pg (ref 26.0–34.0)
MCHC: 33.3 g/dL (ref 30.0–36.0)
MCV: 85.2 fL (ref 80.0–100.0)
Monocytes Absolute: 0.7 10*3/uL (ref 0.1–1.0)
Monocytes Relative: 9 %
Neutro Abs: 3.3 10*3/uL (ref 1.7–7.7)
Neutrophils Relative %: 39 %
Platelet Count: 310 10*3/uL (ref 150–400)
RBC: 5.19 MIL/uL (ref 4.22–5.81)
RDW: 12.8 % (ref 11.5–15.5)
WBC Count: 8.5 10*3/uL (ref 4.0–10.5)
nRBC: 0 % (ref 0.0–0.2)

## 2021-05-16 ENCOUNTER — Telehealth: Payer: Self-pay | Admitting: Physician Assistant

## 2021-05-16 NOTE — Telephone Encounter (Signed)
Release:[id number]  Outgoing Referral, Faxed records to [facility] @ Fax# [phone]

## 2021-05-26 ENCOUNTER — Other Ambulatory Visit: Payer: Self-pay | Admitting: Gastroenterology

## 2021-05-26 DIAGNOSIS — R1013 Epigastric pain: Secondary | ICD-10-CM

## 2021-07-18 ENCOUNTER — Ambulatory Visit (INDEPENDENT_AMBULATORY_CARE_PROVIDER_SITE_OTHER): Payer: 59 | Admitting: Medical

## 2021-07-18 ENCOUNTER — Other Ambulatory Visit: Payer: Self-pay

## 2021-07-18 VITALS — BP 124/80 | HR 80 | Wt 252.8 lb

## 2021-07-18 DIAGNOSIS — R221 Localized swelling, mass and lump, neck: Secondary | ICD-10-CM | POA: Insufficient documentation

## 2021-07-18 DIAGNOSIS — Z23 Encounter for immunization: Secondary | ICD-10-CM | POA: Diagnosis not present

## 2021-07-18 DIAGNOSIS — L732 Hidradenitis suppurativa: Secondary | ICD-10-CM | POA: Insufficient documentation

## 2021-07-18 DIAGNOSIS — R682 Dry mouth, unspecified: Secondary | ICD-10-CM | POA: Diagnosis not present

## 2021-07-18 MED ORDER — MUPIROCIN 2 % EX OINT: 1.0000 "application " | TOPICAL_OINTMENT | Freq: Two times a day (BID) | CUTANEOUS | 2 refills | Status: DC

## 2021-07-18 MED ORDER — SULFAMETHOXAZOLE-TRIMETHOPRIM 800-160 MG PO TABS: 1.0000 | ORAL_TABLET | Freq: Two times a day (BID) | ORAL | 1 refills | Status: DC

## 2021-07-18 MED ORDER — HIBICLENS 4 % EX LIQD: Freq: Every day | CUTANEOUS | 2 refills | Status: DC | PRN

## 2021-07-18 NOTE — Patient Instructions (Addendum)
Your exam findings show hidradenitis suppurative.  This typically is a chronic condition that can get worse.  This condition often can recur.  You have noticed boils or sores under the abdomen and on the buttocks as well.  Recommendations: One of the most important treatment aspect is to lose weight.  People that are overweight or obese tend to get this problem more frequently than people that are low obese.  So I recommend you take significant steps to eat healthy, exercise regularly and lose weight For the short-term flareup in your armpits I recommend you begin Bactrim antibiotic twice daily for a week to help calm down the flareup for infection. You can also use topical mupirocin ointment twice daily for this week to the areas that are tender and swollen.  You can use this in the future for other flareups Use hot compresses such as a hot moist towel in the armpit for 20 minutes twice a day for the next week I prescribed a medicated soap called Hibiclens.  I recommend you use this as a head to toe body wash once a week for the next several weeks to see if this cuts down on flareups. If this continues to be a recurrent problem, sometimes we refer to dermatology skin doctor or even plastic surgery if it becomes severe. When you have a flareup starting like this I would avoid deodorant until this clears up.  Then I would use a new fresh deodorant is not contaminated.    ????????:  ??? ??? ????? ?????? ?? ????? ?????. ???? ??????? ????? ?????? ?? ????? ????? ?? ?????? ??? ???? ??? ??????? ???? ????? ???? ?? ??????? ????? ?????? ?? ?????? ??????. ???? ????? ?????? ????? ???? ?????? ?????? ????? ??????? ??????? ??????? ?????? ?????  ?? ??? ?????? ????? ????? ?? ????? ? ????? ???? ?????? ?????? ??????? ????? ?????? ???? ????? ???????? ?? ????? ??????????.  ????? ????? ??????? ???? ????????? ????? ????? ?????? ???? ??????? ??????? ???? ????? ?? ????? ?????????. ????? ??????? ??? ?? ???????? ????????  ????  ?????? ?????? ????? ??? ????? ???? ????? ?? ????? ???? 20 ????? ????? ?? ????? ??????? ??????  ????? ??????? ????? ???? Hibiclens. ????? ???????? ??? ????? ????? ?? ????? ??? ???? ??????? ??? ????? ?? ??????? ???????? ??????? ??????? ?????? ?? ??? ??? ??? ???? ?? ???????.  ??? ?????? ??? ??????? ?? ?????? ? ????? ???? ??????? ??? ???? ??????? ??????? ?? ??? ??????? ????????? ??? ????? ?????.  ????? ???? ???? ?????? ??? ??? ? ?????? ???? ????? ??? ???? ???. ?? ??????? ???? ????? ?????? ??? ??????. altawsiati:  'ahad 'ahami jawanib aleilaj hu 'iinqas alwazni. yamil al'ashkhas aladhin yueanun min ziadat alwazn 'aw alsimnat 'iilaa huduth hadhih almushkilat bishakl mutakarir 'akthar min al'ashkhas aladhin yueanun min ainkhifad alsimna. lidhalik 'uwsik biaitikhadh khatawat muhimat litanawul altaeam alsihiyi wamumarasat alriyadat biaintizam wafuqdan alwazn  min 'ajl alnuwbat qasirat almadaa fi al'iibit , 'uwsik bibad' almadadi alhayawii biaktirim maratayn ywmyan limudat 'usbue lilmusaeadat fi tahdiat alailtihabati.  yumkinuk aydan aistikhdam marham mubirusin mawdiei maratayn ywmyan lihadha al'usbue lilmanatiq alati tueani min al'alam walmutawrimati. yumkinuk aistikhdam hadha fi almustaqbal litafjirat 'ukhraa  astakhdim kamadat sakhinat mithl minshafat ratbat sakhinat fi al'iibit limudat 20 daqiqat maratayn fi alyawm lil'usbue altaali  wsft sabwnan tbyan yusamaa Hibiclens. 'uwsik biastikhdam hadha kaghasul liljism min alraas 'iilaa 'akhmas alqadamayn maratan wahidatan fi al'usbue lil'asabie aleadidat alqadimat limaerifat ma 'iidha kan hadha yuqalil min alnuwbat.  'iidhan aistamarat hadhih almushkilat fi alzuhur , fa'iinana narjie ahyanan 'iilaa tabib al'amrad aljildiat 'aw hataa aljirahat altajmiliat '  iidha 'asbahat shadidatun.  eindama yakun ladayk aishtieal mithl hadha , sa'atajanab mazil aleiraq hataa yazul hadha. thuma sa'astakhdim mazil aleiraq aljadid Theatre stage manager.    Hidradenitis  Suppurativa Hidradenitis suppurativa is a long-term (chronic) skin disease. It is similar to a severe form of acne, but it affects areas of the body where acne would be unusual, especially areas of the body where skin rubs against skin and becomes moist. These include: Underarms. Groin. Genital area. Buttocks. Upper thighs. Breasts. Hidradenitis suppurativa may start out as small lumps or pimples caused by blocked sweat glands or hair follicles. Pimples may develop into deep sores that break open (rupture) and drain pus. Over time, affected areas of skin may thicken and become scarred. This condition is rare and does not spread from person to person (non-contagious). What are the causes? The exact cause of this condition is not known. It may be related to: Male and male hormones. An overactive disease-fighting system (immune system). The immune system may over-react to blocked hair follicles or sweat glands and cause swelling and pus-filled sores. What increases the risk? You are more likely to develop this condition if you: Are male. Are 31-33 years old. Have a family history of hidradenitis suppurativa. Have a personal history of acne. Are overweight. Smoke. Take the medicine lithium. What are the signs or symptoms? The first symptoms are usually painful bumps in the skin, similar to pimples. The condition may get worse over time (progress), or it may only cause mild symptoms. If the disease progresses, symptoms may include: Skin bumps getting bigger and growing deeper into the skin. Bumps rupturing and draining pus. Itchy, infected skin. Skin getting thicker and scarred. Tunnels under the skin (fistulas) where pus drains from a bump. Pain during daily activities, such as pain during walking if your groin area is affected. Emotional problems, such as stress or depression. This condition may affect your appearance and your ability or willingness to wear certain clothes or do  certain activities. How is this diagnosed? This condition is diagnosed by a health care provider who specializes in skin diseases (dermatologist). You may be diagnosed based on: Your symptoms and medical history. A physical exam. Testing a pus sample for infection. Blood tests. How is this treated? Your treatment will depend on how severe your symptoms are. The same treatment will not work for everybody with this condition. You may need to try several treatments to find what works best for you. Treatment may include: Cleaning and bandaging (dressing) your wounds as needed. Lifestyle changes, such as new skin care routines. Taking medicines, such as: Antibiotics. Acne medicines. Medicines to reduce the activity of the immune system. A diabetes medicine (metformin). Birth control pills, for women. Steroids to reduce swelling and pain. Working with a mental health care provider, if you experience emotional distress due to this condition. If you have severe symptoms that do not get better with medicine, you may need surgery. Surgery may involve: Using a laser to clear the skin and remove hair follicles. Opening and draining deep sores. Removing the areas of skin that are diseased and scarred. Follow these instructions at home: Medicines  Take over-the-counter and prescription medicines only as told by your health care provider. If you were prescribed an antibiotic medicine, take it as told by your health care provider. Do not stop taking the antibiotic even if your condition improves. Skin care If you have open wounds, cover them with a clean dressing as told by your health care provider. Keep wounds  clean by washing them gently with soap and water when you bathe. Do not shave the areas where you get hidradenitis suppurativa. Do not wear deodorant. Wear loose-fitting clothes. Try to avoid getting overheated or sweaty. If you get sweaty or wet, change into clean, dry clothes as soon as  you can. To help relieve pain and itchiness, cover sore areas with a warm, clean washcloth (warm compress) for 5-10 minutes as often as needed. If told by your health care provider, take a bleach bath twice a week: Fill your bathtub halfway with water. Pour in  cup of unscented household bleach. Soak in the tub for 5-10 minutes. Only soak from the neck down. Avoid water on your face and hair. Shower to rinse off the bleach from your skin. General instructions Learn as much as you can about your disease so that you have an active role in your treatment. Work closely with your health care provider to find treatments that work for you. If you are overweight, work with your health care provider to lose weight as recommended. Do not use any products that contain nicotine or tobacco, such as cigarettes and e-cigarettes. If you need help quitting, ask your health care provider. If you struggle with living with this condition, talk with your health care provider or work with a mental health care provider as recommended. Keep all follow-up visits as told by your health care provider. This is important. Where to find more information Hidradenitis Suppurativa Foundation, Inc.: https://www.hs-foundation.org/ American Academy of Dermatology: [employer] Contact a health care provider if you have: A flare-up of hidradenitis suppurativa. A fever or chills. Trouble controlling your symptoms at home. Trouble doing your daily activities because of your symptoms. Trouble dealing with emotional problems related to your condition. Summary Hidradenitis suppurativa is a long-term (chronic) skin disease. It is similar to a severe form of acne, but it affects areas of the body where acne would be unusual. The first symptoms are usually painful bumps in the skin, similar to pimples. The condition may only cause mild symptoms, or it may get worse over time (progress). If you have open wounds, cover them with  a clean dressing as told by your health care provider. Keep wounds clean by washing them gently with soap and water when you bathe. Besides skin care, treatment may include medicines, laser treatment, and surgery. This information is not intended to replace advice given to you by your health care provider. Make sure you discuss any questions you have with your health care provider. Document Revised: 08/16/2020 Document Reviewed: 08/16/2020 Elsevier Patient Education  2022 [employer].   Regarding the neck symptoms, your 2021 sleep study, scopes of throat and esophagus were normal.  If still having problems I recommend follow up with ENT     Laryngoscopy 07/29/2020 with Dr. Narda Bonds: Comments:     On fiberoptic laryngoscopy the upper airway was clear to examination as  was the vocal cords. He had moderate edema of the arytenoid mucosa but no  mucosal lesions noted. I was able to pass the fiberoptic laryngoscope  through the upper esophageal sphincter without difficulty and the upper  cervical esophagus was clear.   EGD 03/18/2020 Dr. Tressia Danas The examined esophagus was normal. Biopsies were taken from the mid, proximal, and distal with a cold forceps for histology. Estimated blood loss was minimal. Findings: - The entire examined stomach was normal. Biopsies were taken from the antrum, body, and fundus with a cold forceps for histology. Estimated blood  loss was minimal. - The examined duodenum was normal. Biopsies were taken with a cold forceps for histology. Estimated blood loss was minimal. - The cardia and gastric fundus were normal on retroflexion. - The exam was otherwise without abnormality.   Sleep study 9/ 2021 normal

## 2021-07-18 NOTE — Progress Notes (Signed)
Subjective:  Timothy Barry is a 30 y.o. male who presents for Chief Complaint  Patient presents with   underarm pain    Both Underarm pain x 3 days. Had this pain before. Feels like it under the skin. Will given flu shot today     Here for concern about discomfort of his axillary area x3 days.  Has had a similar sensation before.  Feels like it is under the skin.  He notes boils in both armpits.  He has had similar before including under the belly and on his buttocks.  No prior I&D procedure for boils.  No prior treatment but has had this issue before  He also notes a feeling of something stuck in his throat.  He has dry mouth.  Sometimes he wakes up feeling dry in his mouth like he is choking.  He has to drink water immediately.  This is happened several times and continues to be an issue.  No other aggravating or relieving factors.    No other c/o.  Past Medical History:  Diagnosis Date   Abnormal EKG 11/27/2018   GERD (gastroesophageal reflux disease)    HTN (hypertension)    Palpitation 11/27/2018   Current Outpatient Medications on File Prior to Visit  Medication Sig Dispense Refill   atorvastatin (LIPITOR) 80 MG tablet Take 80 mg by mouth daily.     tamsulosin (FLOMAX) 0.4 MG CAPS capsule Take 0.4 mg by mouth daily.     pantoprazole (PROTONIX) 40 MG tablet Take 1 tablet (40 mg total) by mouth 2 (two) times daily. 140 tablet 0   No current facility-administered medications on file prior to visit.     The following portions of the patient's history were reviewed and updated as appropriate: allergies, current medications, past family history, past medical history, past social history, past surgical history and problem list.  ROS Otherwise as in subjective above  Objective: BP 124/80   Pulse 80   Wt 252 lb 12.8 oz (114.7 kg)   BMI 35.26 kg/m   General appearance: alert, no distress, well developed, well nourished HEENT: normocephalic, sclerae anicteric, conjunctiva  pink and moist,  nares patent, no discharge or erythema, pharynx normal Oral cavity: MMM, no lesions Neck: supple, no lymphadenopathy, no thyromegaly, no masses Tender swollen 1 to 2 cm diameter lesions in bilateral armpits consistent with hidradenitis.  No induration, no warmth, no drainage.   Laryngoscopy 07/29/2020 with Dr. Narda Barry: Comments:     On fiberoptic laryngoscopy the upper airway was clear to examination as  was the vocal cords. He had moderate edema of the arytenoid mucosa but no  mucosal lesions noted. I was able to pass the fiberoptic laryngoscope  through the upper esophageal sphincter without difficulty and the upper  cervical esophagus was clear.   EGD 03/18/2020 Dr. Tressia Barry The examined esophagus was normal. Biopsies were taken from the mid, proximal, and distal with a cold forceps for histology. Estimated blood loss was minimal. Findings: - The entire examined stomach was normal. Biopsies were taken from the antrum, body, and fundus with a cold forceps for histology. Estimated blood loss was minimal. - The examined duodenum was normal. Biopsies were taken with a cold forceps for histology. Estimated blood loss was minimal. - The cardia and gastric fundus were normal on retroflexion. - The exam was otherwise without abnormality.   Sleep study 9/ 2021 normal     Assessment: Encounter Diagnoses  Name Primary?   Hidradenitis axillaris Yes   Needs  flu shot    Neck fullness    Dry mouth      Plan: Your exam findings show hidradenitis axillaris.  This typically is a chronic condition that can get worse.  This condition often can recur.  You have noticed boils or sores under the abdomen and on the buttocks as well.  Recommendations: One of the most important treatment aspect is to lose weight.  People that are overweight or obese tend to get this problem more frequently than people that are low obese.  So I recommend you take significant steps  to eat healthy, exercise regularly and lose weight For the short-term flareup in your armpits I recommend you begin Bactrim antibiotic twice daily for a week to help calm down the flareup for infection. You can also use topical mupirocin ointment twice daily for this week to the areas that are tender and swollen.  You can use this in the future for other flareups Use hot compresses such as a hot moist towel in the armpit for 20 minutes twice a day for the next week I prescribed a medicated soap called Hibiclens.  I recommend you use this as a head to toe body wash once a week for the next several weeks to see if this cuts down on flareups. If this continues to be a recurrent problem, sometimes we refer to dermatology skin doctor or even plastic surgery if it becomes severe. When you have a flareup starting like this I would avoid deodorant until this clears up.  Then I would use a new fresh deodorant is not contaminated.  Counseled on the influenza virus vaccine.  Vaccine information sheet given.  Influenza vaccine given after consent obtained.   Neck fullness, dry mouth I reviewed 2021 studies above, laryngoscopy, EGD, and sleep study.  No abnormal concerns.   Advised he f/u with ENT     Timothy Barry was seen today for underarm pain.  Diagnoses and all orders for this visit:  Hidradenitis axillaris  Needs flu shot -     Flu Vaccine QUAD 84mo+IM (Fluarix, Fluzone & Alfiuria Quad PF)  Neck fullness  Dry mouth  Other orders -     sulfamethoxazole-trimethoprim (BACTRIM DS) 800-160 MG tablet; Take 1 tablet by mouth 2 (two) times daily. -     mupirocin ointment (BACTROBAN) 2 %; Apply 1 application topically 2 (two) times daily. -     chlorhexidine (HIBICLENS) 4 % external liquid; Apply topically daily as needed.   Follow up: prn

## 2021-07-19 ENCOUNTER — Ambulatory Visit (INDEPENDENT_AMBULATORY_CARE_PROVIDER_SITE_OTHER): Payer: 59 | Admitting: Internal Medicine

## 2021-07-19 ENCOUNTER — Encounter: Payer: Self-pay | Admitting: Internal Medicine

## 2021-07-19 VITALS — BP 126/82 | HR 97 | Temp 98.1°F | Resp 18 | Ht 71.0 in | Wt 236.2 lb

## 2021-07-19 DIAGNOSIS — R0602 Shortness of breath: Secondary | ICD-10-CM

## 2021-07-19 DIAGNOSIS — D721 Eosinophilia, unspecified: Secondary | ICD-10-CM | POA: Diagnosis not present

## 2021-07-19 NOTE — Progress Notes (Addendum)
NEW PATIENT Date of Service/Encounter:  07/19/21 Referring provider: Heilingoetter, Cassandr* Primary care provider: Jac Canavan, PA-C  Subjective:  Timothy Barry is a 30 y.o. male with a PMHx of GERD, abdominal pain, eosinophilia, hidradenitis axillaris  presenting today for evaluation of eosinophilia and shortness of breath. History obtained from: chart review and patient.   He does not have a history of asthma, but did not feel it was helpful with his shortness of breath.  He used it 14 days straight, once a day.  SOB occurs most days, and this hsa been going on for at least 6 or 7 months.  Symptoms are worst after eating and during the day.  Not present when he wakes up in the morning.  Sometimes with activity.  No cough.  He did not use albuterol to specifically treat these symptoms, just nightly during his trial.  He does have a history of reflux.  He does feel that his SOB has improved since starting pantoprazole (current dosing 40 mg twice daily, and he reports compliance).  He did stop his pantoprazole 3 days prior to today's appointment, and did not notice a difference in his symptoms.  Sometimes hears wheezing when he breaths, but not always associated with SOB.  He no longer has chest pains. His most recent CXR was normal on 06/29/20.  He denies seasonal allergies. Denies rhinorrhea, sneezing, or other rhinitis symptoms.  He has lived in [place] for the past 2 years (in [place]) prior was in [place].   He does occasionally get dry itchy patches on his elbows and ankles.  This was present since he was a child.  Chart Review: 05/15/2021-referral to oncology for eosinophilia- most recent AEC 800.  Prior AEC 1000.  No further work-up pursued by oncology as he does not meet threshold for hypereosinophilic syndrome based on AEC counts. 05/03/2021-last GI appointment-reporting epigastric pain with an impression of suspected abdominal migraines.  EGD showing reflux esophagitis and LPR.  On PPI  twice daily x2 weeks.  Eosinophils on colon biopsy only none seen on other tissues during EGD and colonoscopy.  Negative GI panel and O&P. 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 Cleda Daub on 02/13/2021 shows a ratio of 76% with an FEV1 of 76%.  Normal FVC.  He was provided an albuterol inhaler which she did not find helpful per oncology notes. CT abdomen: (11/20/19) No acute abnormality noted. US abdomen: (01/20/2`: Normal study.  No findings to explain the patient's abdominal pain. CXR (06/29/20): Negative portable AP view of the chest  Other allergy screening: Asthma: no Rhino conjunctivitis: no Food allergy: no Medication allergy: no Hymenoptera allergy: no Urticaria: no Eczema:no   Past Medical History: Past Medical History:  Diagnosis Date   Abnormal EKG 11/27/2018   GERD (gastroesophageal reflux disease)    HTN (hypertension)    Palpitation 11/27/2018   Medication List:  Current Outpatient Medications  Medication Sig Dispense Refill   atorvastatin (LIPITOR) 80 MG tablet Take 80 mg by mouth daily.     pantoprazole (PROTONIX) 40 MG tablet Take 1 tablet (40 mg total) by mouth 2 (two) times daily. 140 tablet 0   tamsulosin (FLOMAX) 0.4 MG CAPS capsule Take 0.4 mg by mouth daily.     chlorhexidine (HIBICLENS) 4 % external liquid Apply topically daily as needed. (Patient not taking:  Reported on 07/19/2021) 120 mL 2   mupirocin ointment (BACTROBAN) 2 % Apply 1 application topically 2 (two) times daily. (Patient not taking: Reported on 07/19/2021) 22 g 2   sulfamethoxazole-trimethoprim (BACTRIM DS) 800-160 MG tablet Take 1 tablet by mouth 2 (two) times daily. (Patient not taking: Reported on 07/19/2021) 14 tablet 1   No current  facility-administered medications for this visit.   Known Allergies:  No Known Allergies Past Surgical History: Past Surgical History:  Procedure Laterality Date   No prior surgery     Family History: Family History  Problem Relation Age of Onset   Esophageal cancer Father    Heart disease Maternal Grandmother 77       MI   Stomach cancer Paternal Grandfather    Stomach cancer Paternal Aunt    Social History: Hrishikesh lives here in [place].  He moved from [place] on 2 years ago.  His only family here are some cousins.  He is not married. His home has carpeting. He is a Naval architect.  ROS:  All other systems negative except as noted per HPI.  Objective:  Blood pressure 126/82, pulse 97, temperature 98.1 F (36.7 C), temperature source Temporal, resp. rate 18, height 5\' 11"  (1.803 m), weight 236 lb 4 oz (107.2 kg), SpO2 98 %. Body mass index is 32.95 kg/m. Physical Exam:  General Appearance:  Alert, cooperative, no distress, appears stated age  Head:  Normocephalic, without obvious abnormality, atraumatic  Eyes:  Conjunctiva clear, EOM's intact  Nose: Nares normal, normal turbinates  Throat: Lips, tongue normal; teeth and gums normal, normal posterior oropharynx  Neck: Supple, symmetrical  Lungs:   Respirations unlabored, no coughing, CTAB  Heart:  Appears well perfused, RRR, no murmur  Extremities: No edema  Skin: Skin color,  turgor normal, no rashes or lesions on visualized portions of skin, skin dry in areas, no rashes  Neurologic: No gross deficits   Diagnostics: Spirometry:  Tracings reviewed. His effort: It was hard to get consistent efforts and there is a question as to whether this reflects a maximal maneuver. FVC: pre 3.49 L, post 3.38L FEV1: 3.45 L, 88 % predicted (pre), 3.20L 82% predicted (post) FEV1/FVC ratio: 118 % (pre), 113% (post) Interpretation: Spirometry consistent with possible restrictive disease but hard to interpret given poor effort.  No  significant bronchodilator response. Please see scanned spirometry results for details.  Skin Testing: Environmental allergy panel.  Results discussed with patient/family.  Airborne Adult Perc - 07/19/21 1442     Time Antigen Placed 1445    Allergen Manufacturer 07/21/21    Location Back    Number of Test 59    1. Control-Buffer 50% Glycerol Negative    2. Control-Histamine 1 mg/ml 3+    3. Albumin saline Negative    4. Bahia Negative    5. Waynette Buttery Negative    6. Johnson Negative    7. [place] Blue Negative    8. Meadow Fescue Negative    9. Perennial Rye Negative    10. Sweet Vernal Negative    11. Timothy Negative    12. Cocklebur Negative    13. Burweed Marshelder Negative    14. Ragweed, short Negative    15. Ragweed, Giant Negative    16. Plantain,  English Negative    17. Lamb's Quarters Negative    18. Sheep Sorrell Negative    19. Rough Pigweed Negative    20. Marsh Elder, Rough Negative    21. Mugwort, Common Negative  22. Ash mix Negative    23. Birch mix Negative    24. Beech American Negative    25. Box, Elder Negative    26. Cedar, red Negative    27. Cottonwood, Guinea-Bissau Negative    28. Elm mix Negative    29. Hickory Negative    30. Maple mix Negative    31. Oak, Guinea-Bissau mix Negative    32. Pecan Pollen Negative    33. Pine mix Negative    34. Sycamore Eastern Negative    35. Walnut, Black Pollen Negative    36. Alternaria alternata Negative    37. Cladosporium Herbarum Negative    38. Aspergillus mix Negative    39. Penicillium mix Negative    40. Bipolaris sorokiniana (Helminthosporium) Negative    41. Drechslera spicifera (Curvularia) Negative    42. Mucor plumbeus Negative    43. Fusarium moniliforme Negative    44. Aureobasidium pullulans (pullulara) Negative    45. Rhizopus oryzae Negative    46. Botrytis cinera Negative    47. Epicoccum nigrum Negative    48. Phoma betae Negative    49. Candida Albicans Negative    50. Trichophyton  mentagrophytes Negative    51. Mite, D Farinae  5,000 AU/ml Negative    52. Mite, D Pteronyssinus  5,000 AU/ml Negative    53. Cat Hair 10,000 BAU/ml Negative    54.  Dog Epithelia Negative    55. Mixed Feathers Negative    56. Horse Epithelia 3+    57. Cockroach, German Negative    58. Mouse 3+    59. Tobacco Leaf Negative             Allergy testing results were read and interpreted by myself, documented by clinical staff.  Assessment:  Eosinophilia, unspecified type -  He does not meet criteria for hypereosinophilia syndrome, and so therefore would not qualify for any medications geared toward this or more invasive work-up at this time. He does have non-specific colitis.   His biopsy does show several ova- like structures which are "suggestive of a parasitic infection." Unclear if he was treated for this.  An absolute eosinophil number was not given on biopsy.  Ova and parasite as well as GI pathogen panel were negative.  He has not been evaluated for strongyloides.  We will check for this today.  We will also order tryptase, total IgE, and vitamin B-12 levels for completion of the work-up of hypereosinophilic syndrome although he does not meet criteria for this at this time. He has allergy testing positive to horse and mouse but denies allergic rhinitis symptoms so doubt this is clinically significant.  Do not suspect allergies playing major role in his eosinophilia. He does have some mild dry skin, but nothing consistent with eczema on today's exam.  Shortness of breath Based on history suspect that this is mostly coming from his reflux.  He reports improvement when taking Protonix.  His spirometry today was not supportive of asthma, but his effort was poor.  He reports an unsuccessful trial with albuterol, but was not using to treat symptoms rather on a daily basis.  He will try using in the setting of symptoms to see if this is helpful.  If question of asthma still remains, could  consider methacholine challenge.  Low suspicion of asthma at this time.  Plan/Recommendations:   Patient Instructions  Elevated Eosinophil Count - labs today: total IgE, strongyloides, tryptase level, vitamin B12 level (screening for elevated eosinophils) -  we will call you once all results have returned - your environmental panel was positive to horse and mice only; this is unlikely contributing to your symptoms based on history - you are scheduled to get a repeat CBCd with Oncology in a few weeks; will follow these results at your next visit - do not suspect allergies as a major cause of these cells being elevated based on today's results and history  Shortness of Breath:  - possibly due to multiple factors - your lung testing today looked good, no signs of asthma but this does not definitively rule out asthma - I suspect your reflux is playing a significant role - continue to take your Protonix as directed - please try Albuterol inhaler 2 puffs every 4 to 6 hours AS NEEDED (only use when you feel short of breath) - make note if this is helpful to you and keep track   Follow-up in 6 to 8 weeks.   This note in its entirety was forwarded to the Provider who requested this consultation.  Thank you for your kind referral. I appreciate the opportunity to take part in Benzion's care. Please do not hesitate to contact me with questions.  Sincerely,  Tonny Bollman, MD [facility]

## 2021-07-19 NOTE — Patient Instructions (Addendum)
Elevated Eosinophil Count - labs today: total IgE, strongyloides, tryptase level, vitamin B12 level (screening for elevated eosinophils) - we will call you once all results have returned - your environmental panel was positive to horse and mice only; this is unlikely contributing to your symptoms based on history - you are scheduled to get a repeat CBCd with Oncology in a few weeks; will follow these results at your next visit - do not suspect allergies as a major cause of these cells being elevated  Shortness of Breath:  - possibly due to multiple factors - your lung testing today looked good, no signs of asthma but this does not definitively rule out asthma - I suspect your reflux is playing a significant role - continue to take your Protonix as directed - please try Albuterol inhaler 2 puffs every 4 to 6 hours AS NEEDED (only use when you feel short of breath) - make note if this is helpful to you and keep track   Follow-up in 6 to 8 weeks.

## 2021-07-24 LAB — TRYPTASE: Tryptase: 5.9 ug/L (ref 2.2–13.2)

## 2021-07-24 LAB — STRONGYLOIDES, AB, IGG: Strongyloides, Ab, IgG: NEGATIVE

## 2021-07-24 LAB — VITAMIN B12: Vitamin B-12: 485 pg/mL (ref 232–1245)

## 2021-07-24 LAB — IGE: IgE (Immunoglobulin E), Serum: 359 IU/mL (ref 6–495)

## 2021-07-24 NOTE — Progress Notes (Signed)
Please inform patient that his labs have returned since visit with allergy and are reassuring.  They do not show any red flags for why his eosinophil (inflammatory cells) might be elevated.  He does not have positive strongyloides testing (a parasite we checked for) ruling this out.   We will see him in follow-up as scheduled.

## 2021-07-26 ENCOUNTER — Telehealth: Payer: Self-pay | Admitting: Internal Medicine

## 2021-07-26 NOTE — Telephone Encounter (Signed)
Spoke to patient and explained to him his lab results and he understood and stated he will see Dr. Maurine Minister on October 26.  [phone]

## 2021-07-26 NOTE — Telephone Encounter (Signed)
Patient is calling in for labs results please advise

## 2021-07-30 IMAGING — DX DG CHEST 1V PORT
1 series · 1 of 1 positions shown · non-contrast
Comparison: None.

CLINICAL DATA: Chest pain

EXAM:
PORTABLE CHEST 1 VIEW

[chest ap]
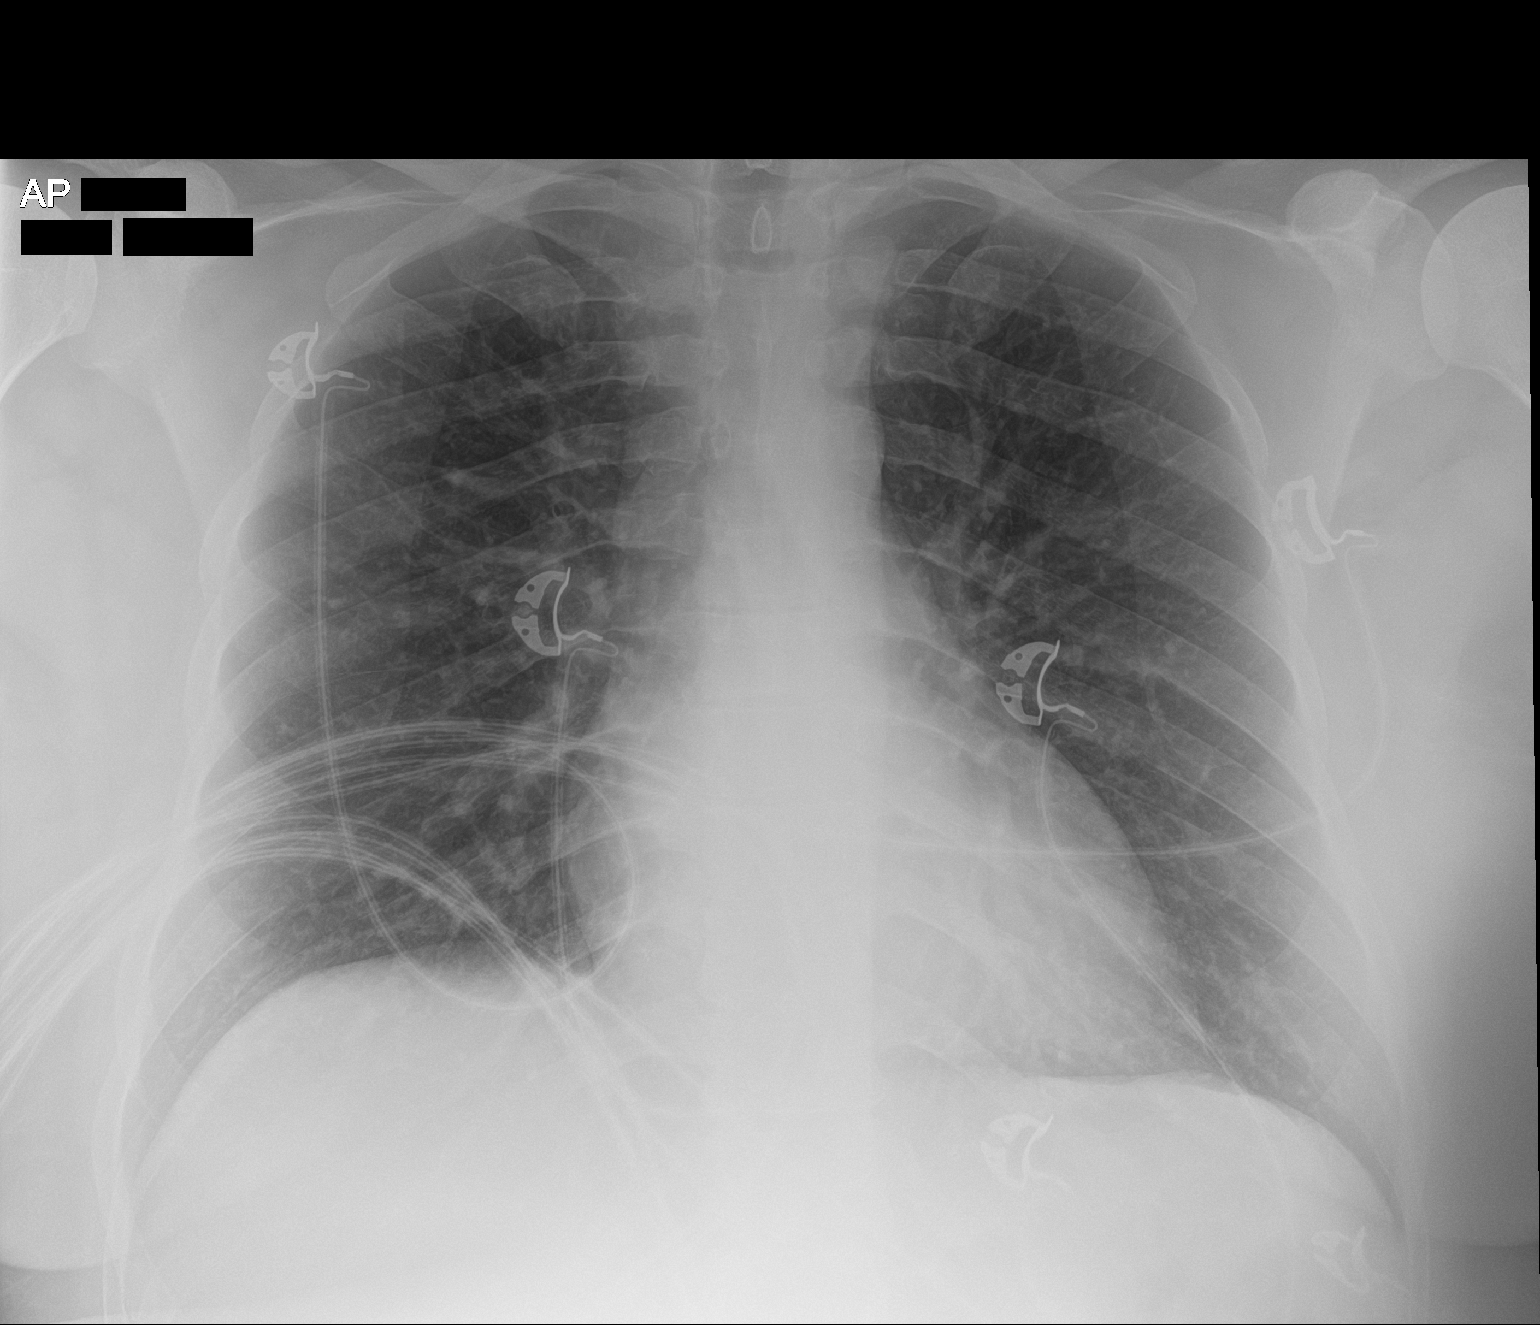

[1 of 1 positions shown; findings below may reference images not displayed]

FINDINGS: The heart size and mediastinal contours are within normal limits.
Both lungs are clear. The visualized skeletal structures are
unremarkable.
IMPRESSION: No active disease.

## 2021-07-31 ENCOUNTER — Ambulatory Visit: Payer: 59 | Admitting: Allergy

## 2021-08-04 IMAGING — CR DG CHEST 2V
2 series · 2 of 2 positions shown · non-contrast
Comparison: November 20, 2019

CLINICAL DATA: Chest pain.

EXAM:
CHEST - 2 VIEW

[w chest pa]
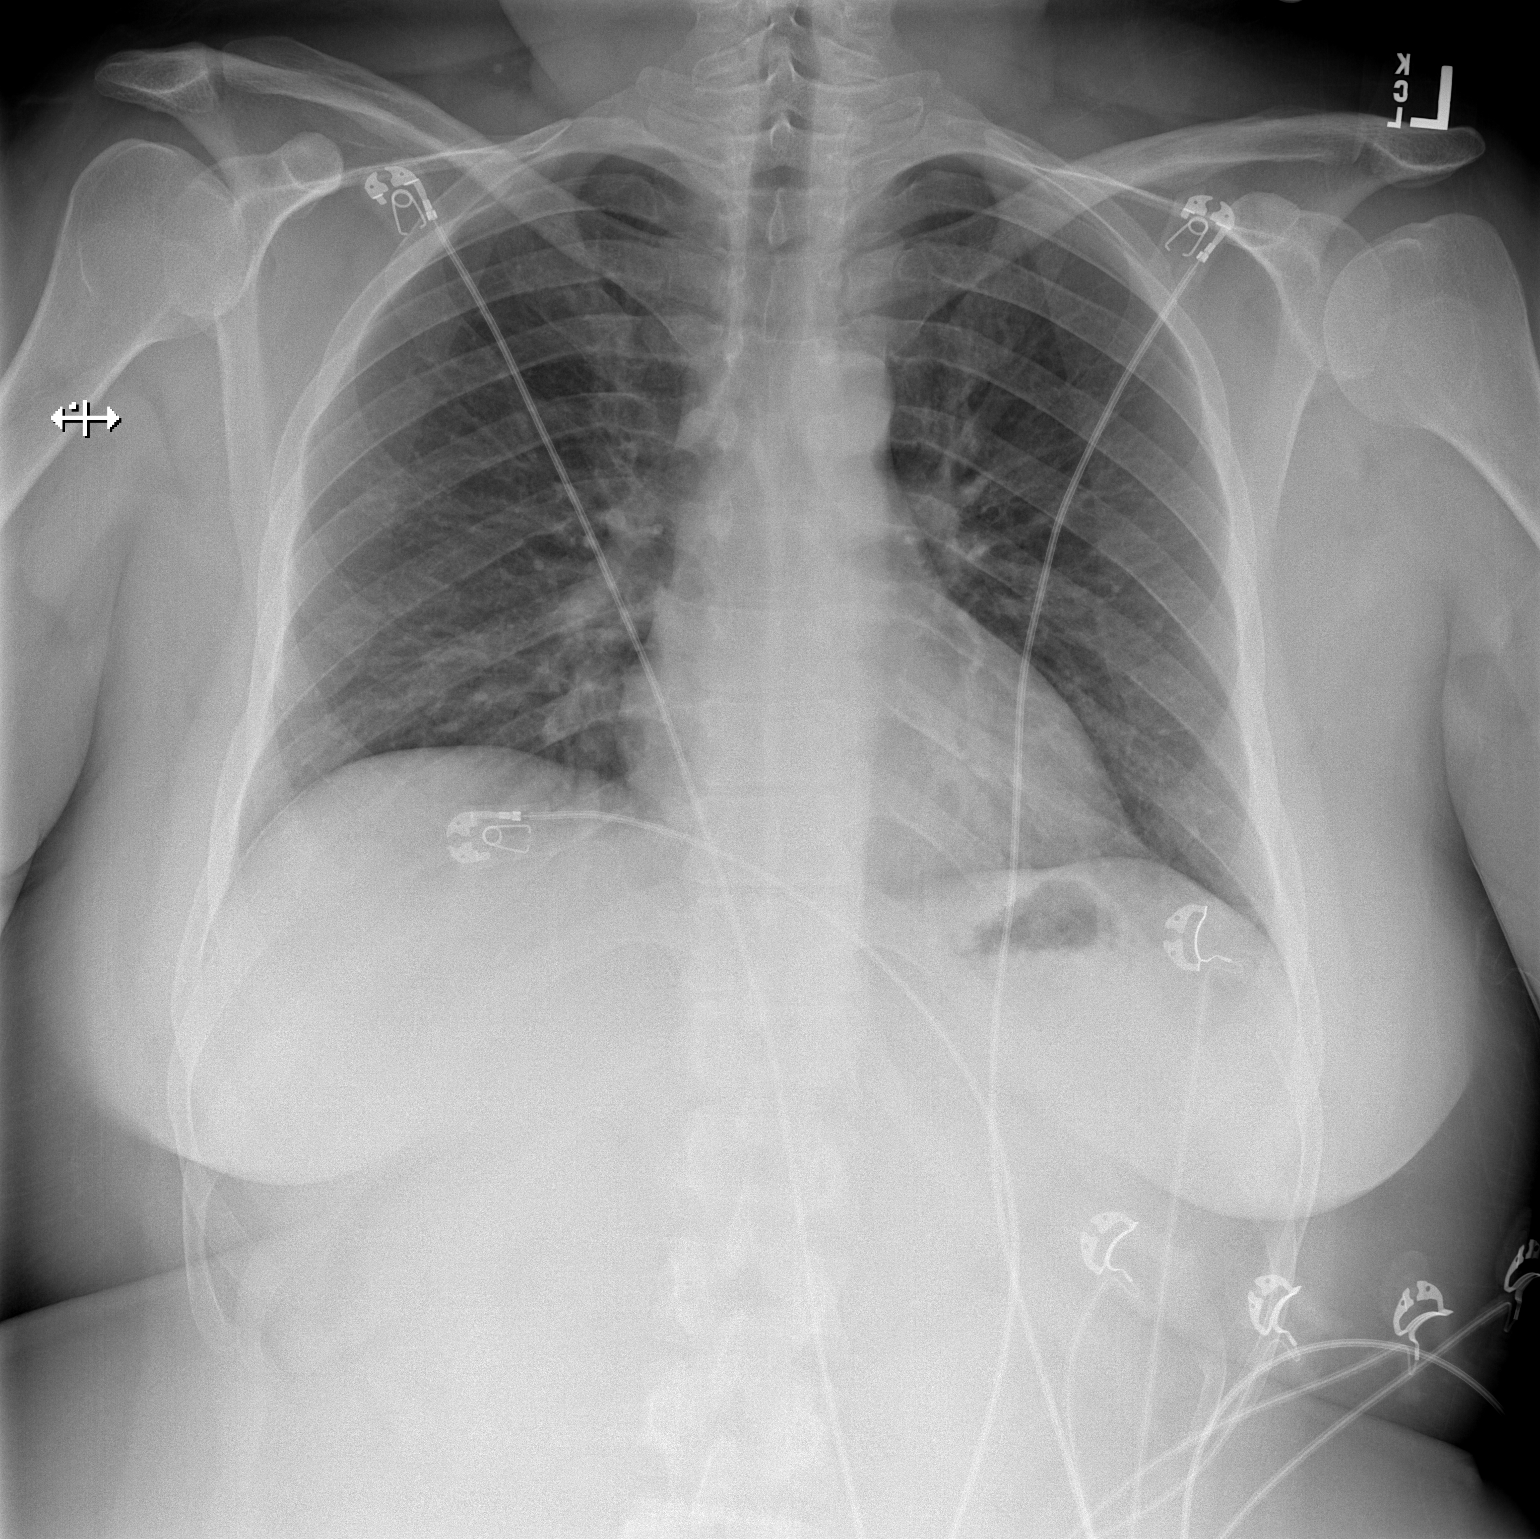

[w chest lat]
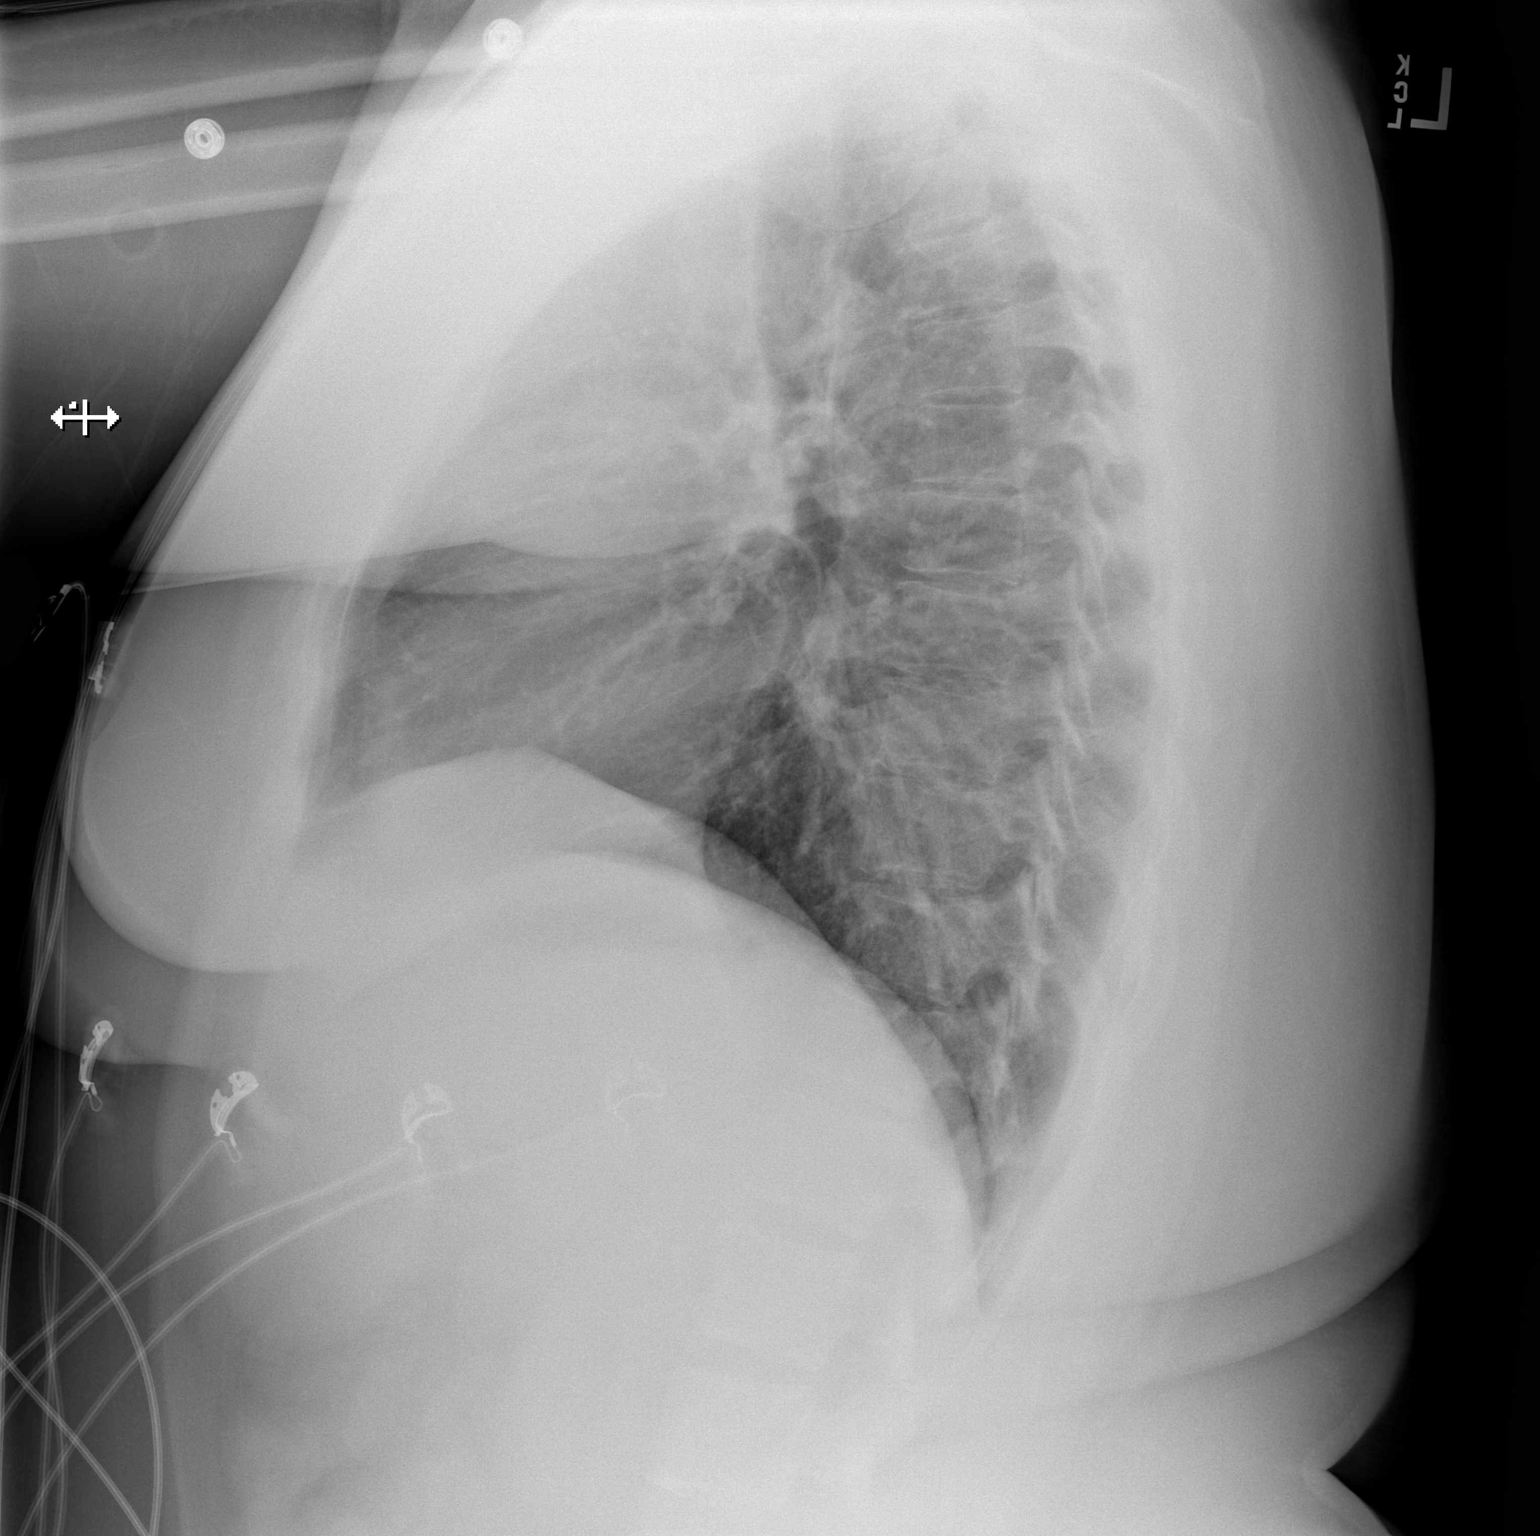

[2 of 2 positions shown; findings below may reference images not displayed]

FINDINGS: The heart size and mediastinal contours are within normal limits.
Both lungs are clear. The visualized skeletal structures are
unremarkable.
IMPRESSION: No active cardiopulmonary disease.

## 2021-08-04 IMAGING — US US ABDOMEN LIMITED
1 series · 14 of 25 positions shown · non-contrast
Comparison: 11/21/2019

CLINICAL DATA: Right upper quadrant abdominal pain.

EXAM:
ULTRASOUND ABDOMEN LIMITED RIGHT UPPER QUADRANT

[Series 1: us abdomen limited · 14 of 45 slices shown]
[im 1/45]
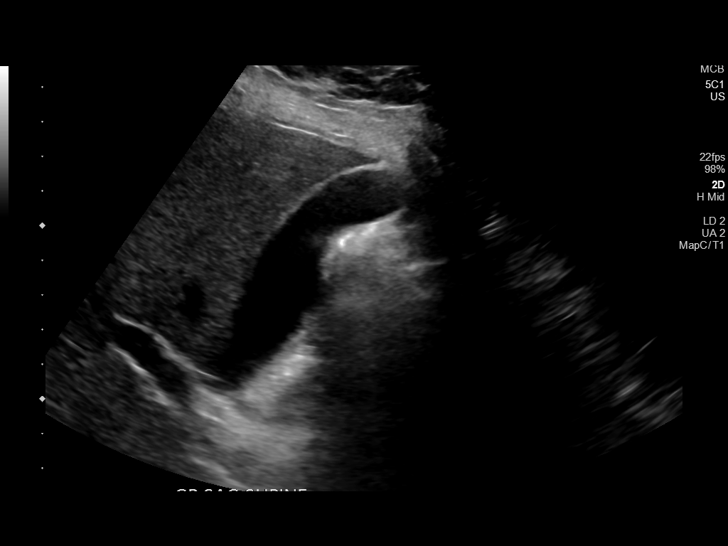
[im 4/45]
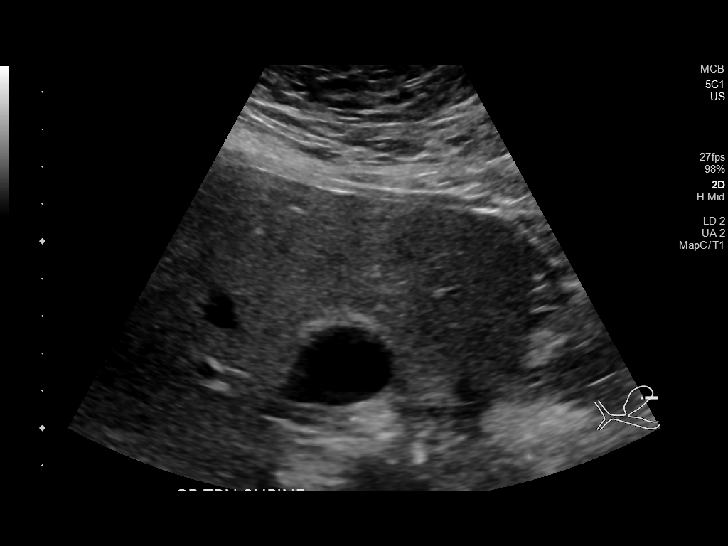
[im 8/45]
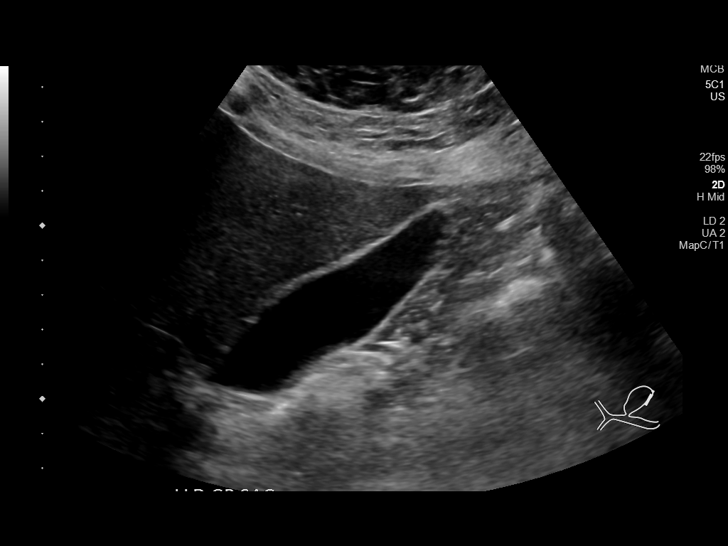
[im 12/45]
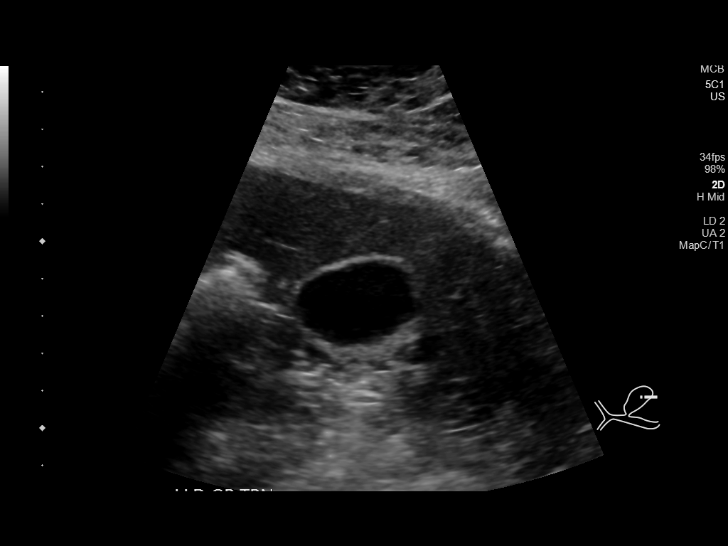
[im 15/45]
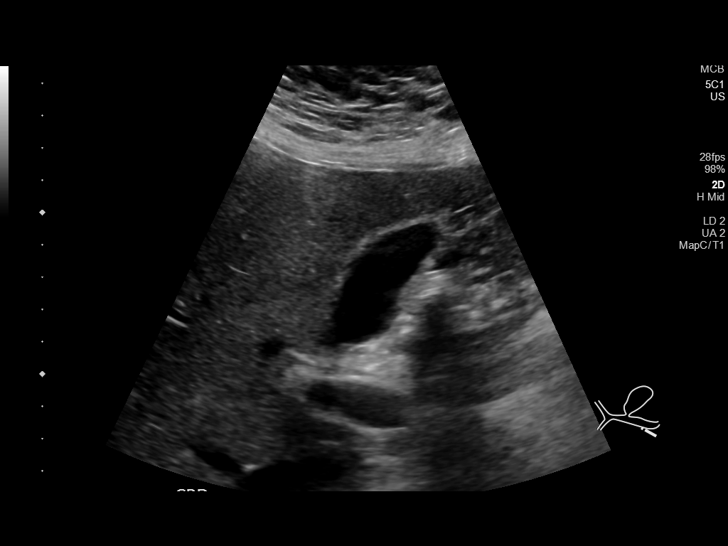
[im 17/45]
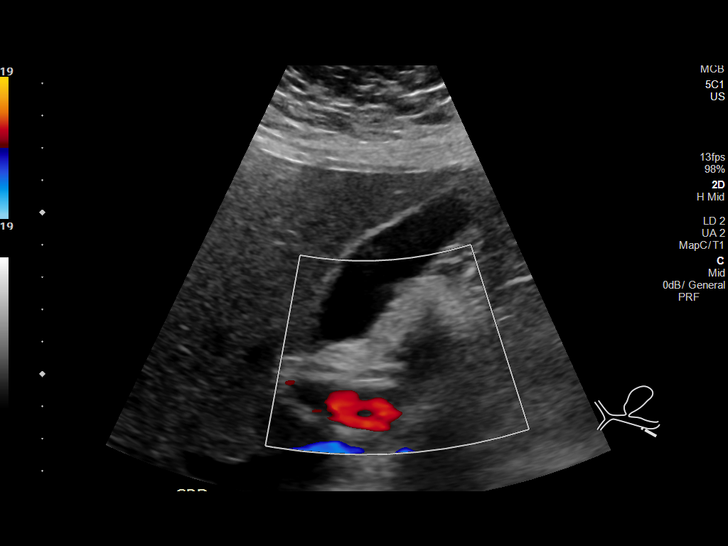
[im 21/45]
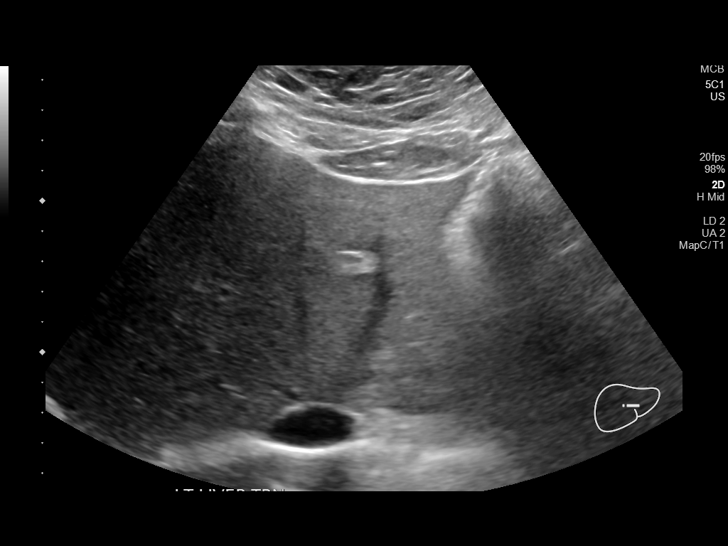
[im 24/45]
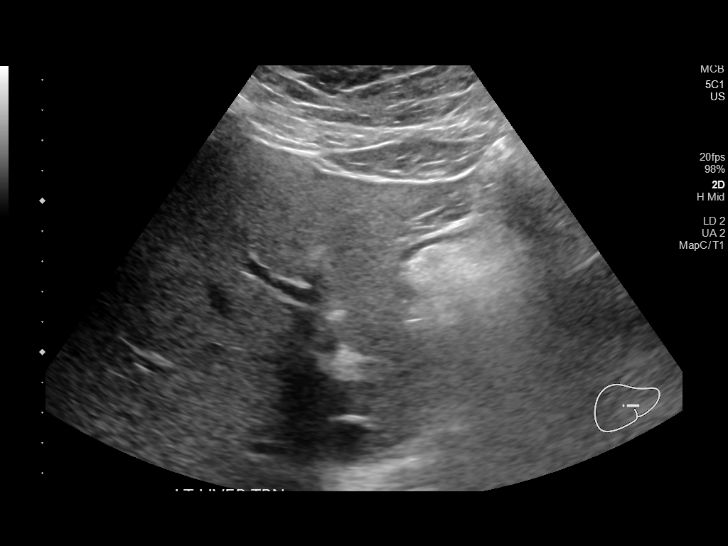
[im 28/45]
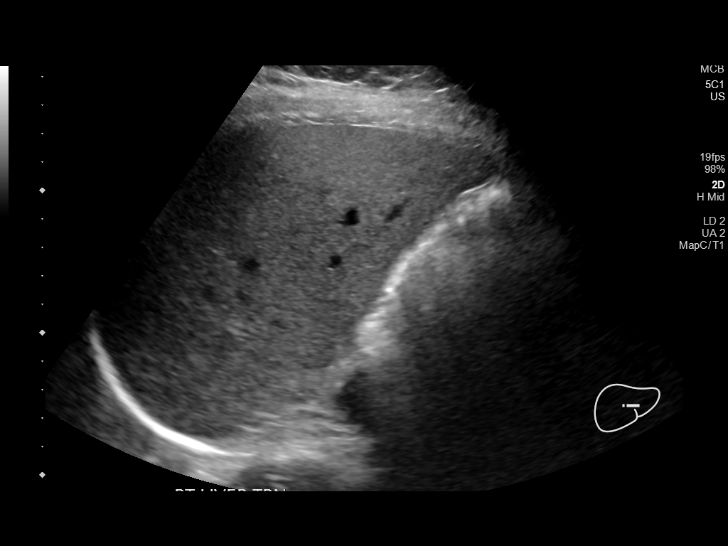
[im 30/45]
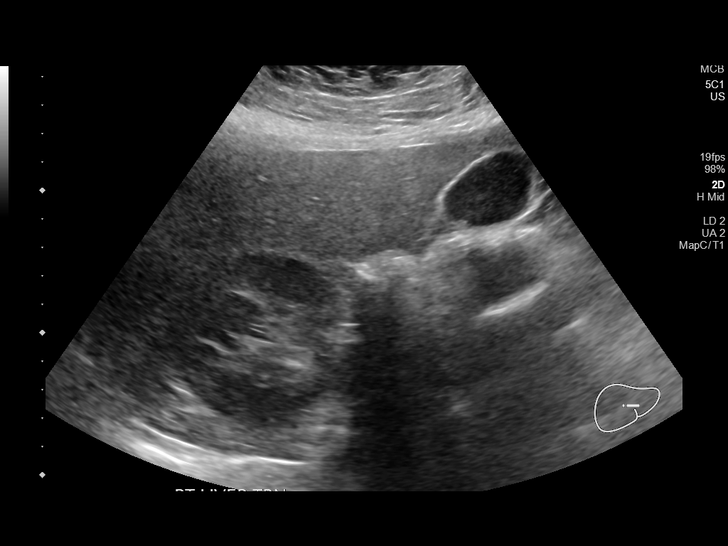
[im 34/45]
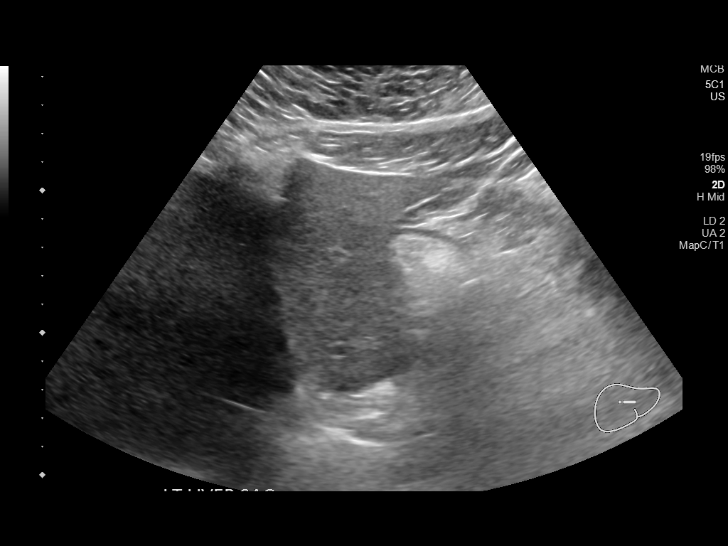
[im 37/45]
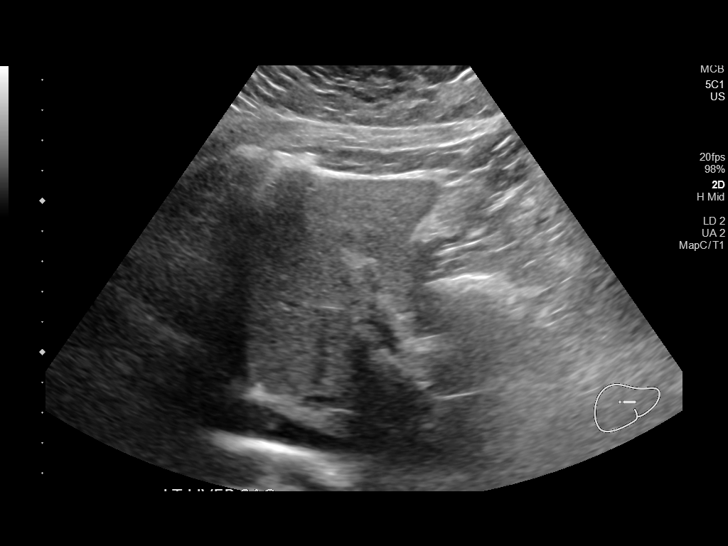
[im 41/45]
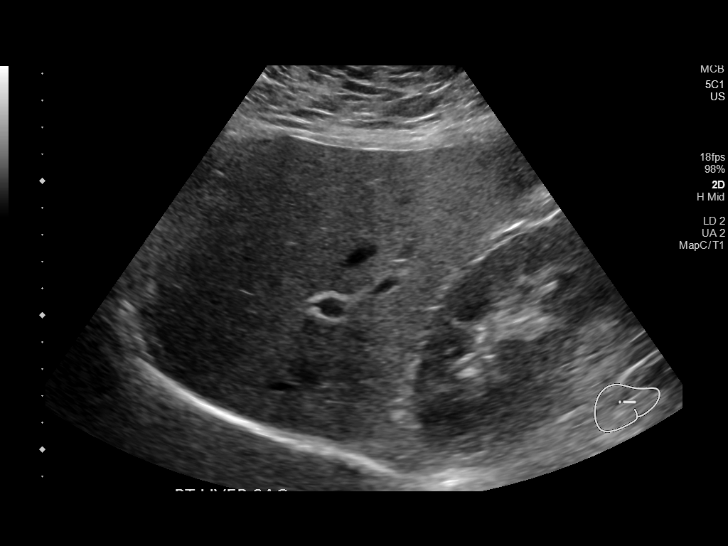
[im 45/45]
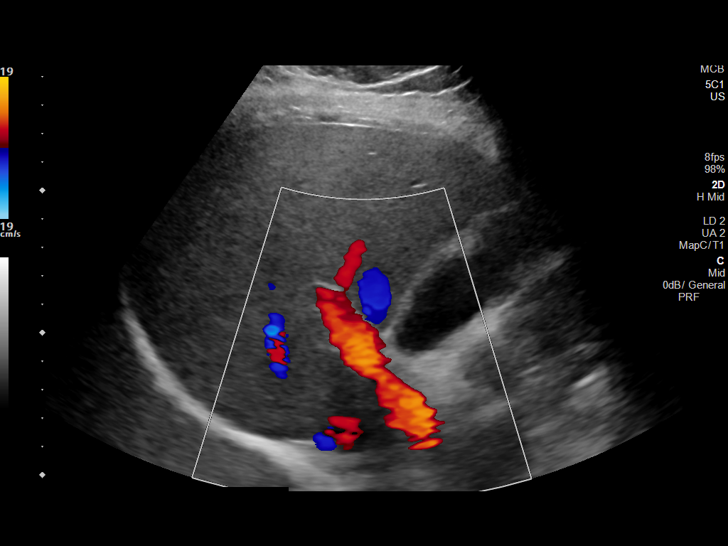

[14 of 25 positions shown; findings below may reference images not displayed]

FINDINGS: Gallbladder:

No gallstones or wall thickening visualized. No sonographic Murphy
sign noted by sonographer.

Common bile duct:

Diameter: 3 mm

Liver:

No focal lesion identified. Within normal limits in parenchymal
echogenicity. Portal vein is patent on color Doppler imaging with
normal direction of blood flow towards the liver.

Other: None.
IMPRESSION: Normal study.  No findings to explain the patient's abdominal pain.

## 2021-08-14 ENCOUNTER — Inpatient Hospital Stay (HOSPITAL_BASED_OUTPATIENT_CLINIC_OR_DEPARTMENT_OTHER): Payer: 59 | Admitting: Internal Medicine

## 2021-08-14 ENCOUNTER — Inpatient Hospital Stay: Payer: 59 | Attending: Physician Assistant

## 2021-08-14 ENCOUNTER — Encounter: Payer: Self-pay | Admitting: Internal Medicine

## 2021-08-14 ENCOUNTER — Other Ambulatory Visit: Payer: Self-pay

## 2021-08-14 VITALS — BP 127/81 | HR 79 | Temp 97.6°F | Resp 19 | Ht 71.0 in | Wt 252.4 lb

## 2021-08-14 DIAGNOSIS — D721 Eosinophilia, unspecified: Secondary | ICD-10-CM | POA: Diagnosis present

## 2021-08-14 DIAGNOSIS — K529 Noninfective gastroenteritis and colitis, unspecified: Secondary | ICD-10-CM | POA: Diagnosis not present

## 2021-08-14 LAB — CBC WITH DIFFERENTIAL (CANCER CENTER ONLY)
Abs Immature Granulocytes: 0.01 10*3/uL (ref 0.00–0.07)
Basophils Absolute: 0 10*3/uL (ref 0.0–0.1)
Basophils Relative: 1 %
Eosinophils Absolute: 0.8 10*3/uL — ABNORMAL HIGH (ref 0.0–0.5)
Eosinophils Relative: 15 %
HCT: 43 % (ref 39.0–52.0)
Hemoglobin: 14.3 g/dL (ref 13.0–17.0)
Immature Granulocytes: 0 %
Lymphocytes Relative: 39 %
Lymphs Abs: 2.1 10*3/uL (ref 0.7–4.0)
MCH: 28 pg (ref 26.0–34.0)
MCHC: 33.3 g/dL (ref 30.0–36.0)
MCV: 84.1 fL (ref 80.0–100.0)
Monocytes Absolute: 0.6 10*3/uL (ref 0.1–1.0)
Monocytes Relative: 12 %
Neutro Abs: 1.7 10*3/uL (ref 1.7–7.7)
Neutrophils Relative %: 33 %
Platelet Count: 313 10*3/uL (ref 150–400)
RBC: 5.11 MIL/uL (ref 4.22–5.81)
RDW: 13 % (ref 11.5–15.5)
WBC Count: 5.2 10*3/uL (ref 4.0–10.5)
nRBC: 0 % (ref 0.0–0.2)

## 2021-08-14 NOTE — Progress Notes (Signed)
[facility] [phone]   Fax:([phone]  OFFICE PROGRESS NOTE  Jac Canavan, PA-C [address] 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  PRIOR THERAPY: None  CURRENT THERAPY: None  INTERVAL HISTORY: Timothy Barry 30 y.o. male returns to the clinic today for follow-up visit.  The patient is feeling fine today with no concerning complaints except for fatigue and occasional shortness of breath.  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  Few days ago he has 1 episode of dizzy spells that resolved spontaneously after he changed his shift to daytime.  He denied having any current chest pain, cough or hemoptysis.  He denied having any fever or chills.  He has no current nausea, vomiting, diarrhea or constipation.  He was seen by allergy medicine and has no concerning findings to explain his eosinophilia except for the nonspecific colitis.  The patient is here today for evaluation and repeat blood work.  MEDICAL HISTORY: Past Medical History:  Diagnosis Date   Abnormal EKG 11/27/2018   GERD (gastroesophageal reflux disease)    HTN (hypertension)    Palpitation 11/27/2018    ALLERGIES:  has No Known Allergies.  MEDICATIONS:  Current Outpatient Medications  Medication Sig Dispense Refill   atorvastatin (LIPITOR) 80 MG tablet Take 80 mg by mouth daily.     chlorhexidine (HIBICLENS) 4 % external liquid Apply topically daily as needed. (Patient not taking: Reported on 07/19/2021) 120 mL 2   mupirocin ointment (BACTROBAN) 2 % Apply 1 application topically 2 (two) times daily. (Patient not taking: Reported on 07/19/2021) 22 g 2   pantoprazole (PROTONIX) 40 MG tablet Take 1 tablet (40 mg total) by mouth 2 (two) times daily. 140 tablet 0   tamsulosin (FLOMAX) 0.4 MG CAPS capsule Take 0.4 mg by mouth daily.     No current facility-administered medications for this  visit.    SURGICAL HISTORY:  Past Surgical History:  Procedure Laterality Date   No prior surgery      REVIEW OF SYSTEMS:  A comprehensive review of systems was negative except for: Constitutional: positive for fatigue Respiratory: positive for dyspnea on exertion   PHYSICAL EXAMINATION: General appearance: alert, cooperative, and no distress Head: Normocephalic, without obvious abnormality, atraumatic Neck: no adenopathy, no JVD, supple, symmetrical, trachea midline, and thyroid not enlarged, symmetric, no tenderness/mass/nodules Lymph nodes: Cervical, supraclavicular, and axillary nodes normal. Resp: clear to auscultation bilaterally Back: symmetric, no curvature. ROM normal. No CVA tenderness. Cardio: regular rate and rhythm, S1, S2 normal, no murmur, click, rub or gallop GI: soft, non-tender; bowel sounds normal; no masses,  no organomegaly Extremities: extremities normal, atraumatic, no cyanosis or edema  ECOG PERFORMANCE STATUS: 1 - Symptomatic but completely ambulatory  Blood pressure 127/81, pulse 79, temperature 97.6 F (36.4 C), temperature source Tympanic, resp. rate 19, height 5\' 11"  (1.803 m), weight 252 lb 6.4 oz (114.5 kg), SpO2 100 %.  LABORATORY DATA: Lab Results  Component Value Date   WBC 5.2 08/14/2021   HGB 14.3 08/14/2021   HCT 43.0 08/14/2021   MCV 84.1 08/14/2021   PLT 313 08/14/2021      Chemistry      Component Value Date/Time   NA 140 05/15/2021 1322   NA 140 06/06/2020 1011   K 4.6 05/15/2021 1322   CL 103 05/15/2021 1322   CO2 29 05/15/2021 1322   BUN 10 05/15/2021 1322  BUN 10 06/06/2020 1011   CREATININE 0.75 05/15/2021 1322      Component Value Date/Time   CALCIUM 9.5 05/15/2021 1322   ALKPHOS 75 05/15/2021 1322   AST 17 05/15/2021 1322   ALT 18 05/15/2021 1322   BILITOT 0.5 05/15/2021 1322       RADIOGRAPHIC STUDIES: No results found.  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 He had repeat CBC today that showed persistent mild eosinophilia with absolute eosinophil count of 800. I discussed the lab results with the patient and recommended for him to continue on observation for now with routine follow-up visit and evaluation by his primary care physician. I do not see any reason for any further investigation for his mild eosinophilia which is likely secondary to his chronic colitis with eosinophilia. I will see him on as-needed basis at this point. He was advised to call immediately if he has any other concerning symptoms in the interval. The patient voices understanding of current disease status and treatment options and is in agreement with the current care plan.  All questions were answered. The patient knows to call the clinic with any problems, questions or concerns. We can certainly see the patient much sooner if necessary.  The total time spent in the appointment was 20 minutes.  Disclaimer: This note was dictated with voice recognition software. Similar sounding words can inadvertently be transcribed and may not be corrected upon review.

## 2021-08-15 ENCOUNTER — Other Ambulatory Visit: Payer: Self-pay | Admitting: Gastroenterology

## 2021-08-15 DIAGNOSIS — R1013 Epigastric pain: Secondary | ICD-10-CM

## 2021-08-30 ENCOUNTER — Ambulatory Visit (INDEPENDENT_AMBULATORY_CARE_PROVIDER_SITE_OTHER): Payer: 59 | Admitting: Internal Medicine

## 2021-08-30 ENCOUNTER — Other Ambulatory Visit: Payer: Self-pay

## 2021-08-30 ENCOUNTER — Encounter: Payer: Self-pay | Admitting: Internal Medicine

## 2021-08-30 VITALS — BP 122/78 | HR 75 | Temp 97.6°F | Resp 18 | Ht 70.0 in | Wt 255.2 lb

## 2021-08-30 DIAGNOSIS — R0602 Shortness of breath: Secondary | ICD-10-CM | POA: Diagnosis not present

## 2021-08-30 DIAGNOSIS — D721 Eosinophilia, unspecified: Secondary | ICD-10-CM | POA: Diagnosis not present

## 2021-08-30 MED ORDER — ALBUTEROL SULFATE HFA 108 (90 BASE) MCG/ACT IN AERS
2.0000 | INHALATION_SPRAY | Freq: Four times a day (QID) | RESPIRATORY_TRACT | 2 refills | Status: DC | PRN
Start: 2021-08-30 — End: 2022-01-09

## 2021-08-30 NOTE — Progress Notes (Signed)
FOLLOW UP Date of Service/Encounter:  08/30/21   Subjective:  Timothy Barry (DOB: 1990/12/05) is a 30 y.o. male PMHx of GERD, abdominal pain, eosinophilia, hidradenitis axillaris who [facility] in re-evaluation of the following: shortness of breath and hypereosinophilia History obtained from: chart review and patient.  For Review, LV was on 07/19/21  with Dr.Praveen Coia seen for shortness of breath and hypereosinophilia.  Spirometry at that visit ws normal.  Low suspicion for asthma, but albuterol inhaler was provided to determine if this symptomatically provided relief of symptoms.  He does have reflux which is being treated. His eosinophilia level did not reach threshold for HES, but we did obtain some labs for HES since he had already been evaluated by Onc for this problem.  Lab work-up was reassuring (see below).  Today he presents for follow-up and reports his symptoms have been well-controlled.  He has not had any further SOB since our last visit. He did not pick up alubterol from the pharmacy so has not tried it (but also did not need to).  He was relieved to know that his HES lab work-up was reassuring and his most recent AEC was 800-stable from prior.  Advised no further work-up from our standpoint.  Should his SOB return and he finds abuterol helpful, he will make follow-up appointment.  Previous Diagnostics:  Initial Spirometry: FEV1/FVC: 118% ratio, with FEV1 88%-no response to bronchodilator Environmental allergy panel: positive to horse and mouse only Tryptase, Total IgE and vitamin B12 all within normal limits IgE strongyloides negative Most Recent CBCd showed AEC 800 (stable from previous)  Allergies as of 08/30/2021   No Known Allergies      Medication List        Accurate as of August 30, 2021  4:57 PM. If you have any questions, ask your nurse or doctor.          albuterol 108 (90 Base) MCG/ACT inhaler Commonly known  as: VENTOLIN HFA Inhale 2 puffs into the lungs every 6 (six) hours as needed for wheezing or shortness of breath. Started by: Tonny Bollman, MD   atorvastatin 80 MG tablet Commonly known as: LIPITOR Take 80 mg by mouth daily.   Hibiclens 4 % external liquid Generic drug: chlorhexidine Apply topically daily as needed.   mupirocin ointment 2 % Commonly known as: BACTROBAN Apply 1 application topically 2 (two) times daily.   pantoprazole 40 MG tablet Commonly known as: PROTONIX Take 1 tablet (40 mg total) by mouth 2 (two) times daily.   tamsulosin 0.4 MG Caps capsule Commonly known as: FLOMAX Take 0.4 mg by mouth daily.       Past Medical History:  Diagnosis Date   Abnormal EKG 11/27/2018   GERD (gastroesophageal reflux disease)    HTN (hypertension)    Palpitation 11/27/2018   Past Surgical History:  Procedure Laterality Date   No prior surgery     Otherwise, there have been no changes to his past medical history, surgical history, family history, or social history.  ROS: All others negative except as noted per HPI.   Objective:  BP 122/78   Pulse 75   Temp 97.6 F (36.4 C)   Resp 18   Ht 5\' 10"  (1.778 m)   Wt 255 lb 3.2 oz (115.8 kg)   SpO2 99%   BMI 36.62 kg/m  Body mass index is 36.62 kg/m. Physical Exam: General Appearance:  Alert, cooperative, no distress, appears stated age  Head:  Normocephalic, without obvious abnormality, atraumatic  Eyes:  Conjunctiva clear, EOM's intact  Nose: Nares normal, slightly edematous turbinates  Throat: Lips, tongue normal; teeth and gums normal, normal oropharynx  Neck: Supple, symmetrical  Lungs:   CTAB, Respirations unlabored, no coughing  Heart:  RRR, no murmur, Appears well perfused  Extremities: No edema  Skin: Skin color, texture, turgor normal, no rashes or lesions on visualized portions of skin  Neurologic: No gross deficits   Assessment/Plan   Patient Instructions  Elevated Eosinophil Count - previous  labs were all normal (total IgE, strongyloides, tryptase level, vitamin B12 level) - your environmental panel was positive to horse and mice only; this is unlikely contributing to your symptoms based on history - do not suspect allergies as a major cause of these cells being elevated - since most recent level is stable, no further work-up at this time - if level becomes > 1500 consistently, please let us know and we can repeat evaluation  Shortness of Breath:  - possibly due to multiple factors - your lung testing from last appointment looked great - I suspect your reflux is playing a significant role - continue to take your Protonix as directed - please try Albuterol inhaler 2 puffs every 4 to 6 hours AS NEEDED (only use when you feel short of breath) - make note if this is helpful to you and keep track of how often you are using   Follow-up as needed if shortness of breath returns and albuterol is helpful or if eosinophil count becomes consistently > 1500.   It was a please seeing you again today in clinic!   Tonny Bollman, MD  [facility]

## 2021-08-30 NOTE — Patient Instructions (Addendum)
Elevated Eosinophil Count - previous labs were all normal (total IgE, strongyloides, tryptase level, vitamin B12 level) - your environmental panel was positive to horse and mice only; this is unlikely contributing to your symptoms based on history - do not suspect allergies as a major cause of these cells being elevated - since most recent level is stable, no further work-up at this time - if level becomes > 1500 consistently, please let us know and we can repeat evaluation  Shortness of Breath:  - possibly due to multiple factors - your lung testing from last appointment looked great - I suspect your reflux is playing a significant role - continue to take your Protonix as directed - please try Albuterol inhaler 2 puffs every 4 to 6 hours AS NEEDED (only use when you feel short of breath) - make note if this is helpful to you and keep track of how often you are using   Follow-up as needed if shortness of breath returns and albuterol is helpful or if eosinophil count becomes consistently > 1500.   It was a please seeing you again today in clinic!

## 2021-09-18 ENCOUNTER — Other Ambulatory Visit: Payer: Self-pay

## 2021-09-18 DIAGNOSIS — R1013 Epigastric pain: Secondary | ICD-10-CM

## 2022-01-08 ENCOUNTER — Emergency Department (HOSPITAL_COMMUNITY): Payer: 59

## 2022-01-08 ENCOUNTER — Emergency Department (HOSPITAL_COMMUNITY)
Admission: EM | Admit: 2022-01-08 | Discharge: 2022-01-09 | Disposition: A | Discharge status: Home / Self Care | Payer: 59 | Attending: Emergency Medicine | Admitting: Emergency Medicine

## 2022-01-08 DIAGNOSIS — R002 Palpitations: Secondary | ICD-10-CM | POA: Diagnosis not present

## 2022-01-08 DIAGNOSIS — I10 Essential (primary) hypertension: Secondary | ICD-10-CM | POA: Insufficient documentation

## 2022-01-08 LAB — BASIC METABOLIC PANEL
Anion gap: 9 (ref 5–15)
BUN: 10 mg/dL (ref 6–20)
CO2: 27 mmol/L (ref 22–32)
Calcium: 9.3 mg/dL (ref 8.9–10.3)
Chloride: 101 mmol/L (ref 98–111)
Creatinine, Ser: 0.8 mg/dL (ref 0.61–1.24)
GFR, Estimated: >60 mL/min (ref >60)
Glucose, Bld: 98 mg/dL (ref 70–99)
Potassium: 4.2 mmol/L (ref 3.5–5.1)
Sodium: 137 mmol/L (ref 135–145)

## 2022-01-08 LAB — CBC
HCT: 45.8 % (ref 39.0–52.0)
Hemoglobin: 15.1 g/dL (ref 13.0–17.0)
MCH: 28.5 pg (ref 26.0–34.0)
MCHC: 33 g/dL (ref 30.0–36.0)
MCV: 86.4 fL (ref 80.0–100.0)
Platelets: 342 10*3/uL (ref 150–400)
RBC: 5.3 MIL/uL (ref 4.22–5.81)
RDW: 12.4 % (ref 11.5–15.5)
WBC: 7.9 10*3/uL (ref 4.0–10.5)
nRBC: 0 % (ref 0.0–0.2)

## 2022-01-08 LAB — TROPONIN I (HIGH SENSITIVITY)
Troponin I (High Sensitivity): 5 ng/L (ref <18)
Troponin I (High Sensitivity): 3 ng/L (ref <18)

## 2022-01-08 NOTE — ED Triage Notes (Signed)
Pt c/o intermittent "abnormal heartbeat" x4 days. Endorses associated weakness corresponding to palpitations, denies CP, SHOB, sick contact ?

## 2022-01-09 ENCOUNTER — Encounter (HOSPITAL_COMMUNITY): Payer: Self-pay | Admitting: Student

## 2022-01-09 LAB — TSH: TSH: 3.091 u[IU]/mL (ref 0.350–4.500)

## 2022-01-09 NOTE — ED Provider Notes (Signed)
?[facility] ?Provider Note ? ? ?CSN: [id number] ?Arrival date & time: 01/08/22  1650 ? ?  ? ?History ? ?Chief Complaint  ?Patient presents with  ? Palpitations  ? ? ?Abduljabbar Koski is a 31 y.o. male with a hx of HTN who presents to the ED with complaints of intermittent palpitations x 1 month. Describes palpitations as irregular heart beat and fast heart beat, sxs last 20 seconds at a time, occurring multiple times on a daily basis. NO specific triggers or alleviating/aggravating factors. No intervention PTA. Has had some weakness with sxs at times and occasionally some chest tightness. Denies dizziness, syncope, dyspnea, leg pain/swelling or hemoptysis. NO recent lifestyle changes. Denies caffeine intake, medication changes, or sleep changes.  ? ?Interpretor utilized throughout Sales executive.  ? ?HPI ? ?  ? ?Home Medications ?Prior to Admission medications   ?Medication Sig Start Date End Date Taking? Authorizing Provider  ?albuterol (VENTOLIN HFA) 108 (90 Base) MCG/ACT inhaler Inhale 2 puffs into the lungs every 6 (six) hours as needed for wheezing or shortness of breath. ?Patient not taking: Reported on 01/09/2022 08/30/21   Sigurd Sos, MD  ?chlorhexidine (HIBICLENS) 4 % external liquid Apply topically daily as needed. ?Patient not taking: Reported on 01/09/2022 07/18/21   Tysinger, Camelia Eng, PA-C  ?mupirocin ointment (BACTROBAN) 2 % Apply 1 application topically 2 (two) times daily. ?Patient not taking: Reported on 01/09/2022 07/18/21   Tysinger, Camelia Eng, PA-C  ?pantoprazole (PROTONIX) 40 MG tablet Take 1 tablet (40 mg total) by mouth 2 (two) times daily. ?Patient not taking: Reported on 01/09/2022 05/03/21 07/19/21  Thornton Park, MD  ?   ? ?Allergies    ?Patient has no known allergies.   ? ?Review of Systems   ?Review of Systems  ?Constitutional:  Negative for chills and fever.  ?Respiratory:  Negative for cough and shortness of breath.   ?Cardiovascular:  Positive for chest pain and  palpitations. Negative for leg swelling.  ?Gastrointestinal:  Negative for abdominal pain, diarrhea, nausea and vomiting.  ?Neurological:  Positive for weakness. Negative for syncope.  ?All other systems reviewed and are negative. ? ?Physical Exam ?Updated Vital Signs ?BP 135/88 (BP Location: Left Arm)   Pulse 65   Temp 98.8 ?F (37.1 ?C) (Oral)   Resp 16   SpO2 100%  ?Physical Exam ?Vitals and nursing note reviewed.  ?Constitutional:   ?   General: He is not in acute distress. ?   Appearance: He is well-developed. He is not toxic-appearing.  ?HENT:  ?   Head: Normocephalic and atraumatic.  ?Eyes:  ?   General:     ?   Right eye: No discharge.     ?   Left eye: No discharge.  ?   Conjunctiva/sclera: Conjunctivae normal.  ?Cardiovascular:  ?   Rate and Rhythm: Normal rate and regular rhythm.  ?   Pulses:     ?     Radial pulses are 2+ on the right side and 2+ on the left side.  ?     Posterior tibial pulses are 2+ on the right side and 2+ on the left side.  ?   Heart sounds: No murmur heard. ?  No friction rub.  ?Pulmonary:  ?   Effort: Pulmonary effort is normal. No respiratory distress.  ?   Breath sounds: Normal breath sounds. No wheezing, rhonchi or rales.  ?Abdominal:  ?   General: There is no distension.  ?   Palpations: Abdomen is soft.  ?  Tenderness: There is no abdominal tenderness.  ?Musculoskeletal:     ?   General: No tenderness.  ?   Cervical back: Neck supple.  ?   Right lower leg: No edema.  ?   Left lower leg: No edema.  ?Skin: ?   General: Skin is warm and dry.  ?   Findings: No rash.  ?Neurological:  ?   Mental Status: He is alert.  ?   Comments: Clear speech.   ?Psychiatric:     ?   Behavior: Behavior normal.  ? ? ?ED Results / Procedures / Treatments   ?Labs ?(all labs ordered are listed, but only abnormal results are displayed) ?Labs Reviewed  ?BASIC METABOLIC PANEL  ?CBC  ?TSH  ?TROPONIN I (HIGH SENSITIVITY)  ?TROPONIN I (HIGH SENSITIVITY)  ? ? ?EKG ?EKG Interpretation ? ?Date/Time:  Monday  January 08 2022 17:13:45 EST ?Ventricular Rate:  99 ?PR Interval:  150 ?QRS Duration: 88 ?QT Interval:  332 ?QTC Calculation: 426 ?R Axis:   106 ?Text Interpretation: Normal sinus rhythm Rightward axis Borderline ECG When compared with ECG of 29-Jun-2020 20:54, PREVIOUS ECG IS PRESENT Confirmed by Gerlene Fee [phone]) on 01/08/2022 11:41:19 PM ? ?Radiology ?DG Chest 2 View ? ?Result Date: 01/08/2022 ?CLINICAL DATA:  Chest pain EXAM: CHEST - 2 VIEW COMPARISON:  06/29/2020 FINDINGS: Cardiac silhouette and mediastinal contours are within normal limits. The lungs are clear. No pleural effusion or pneumothorax. No acute skeletal abnormality. IMPRESSION: No active cardiopulmonary disease. Electronically Signed   By: Yvonne Kendall M.D.   On: 01/08/2022 17:50   ? ?Procedures ?Procedures  ? ? ?Medications Ordered in ED ?Medications - No data to display ? ?ED Course/ Medical Decision Making/ A&P ?  ?                        ?Medical Decision Making ?Amount and/or Complexity of Data Reviewed ?Labs: ordered. ?Radiology: ordered. ? ? ?Patient presents to the ED with complaints of palpitations, this involves an extensive number of treatment options, and is a complaint that carries with it a high risk of complications and morbidity. Nontoxic, vitals w/ elevated BP- doubt HTN emergency, otherwise unremarkable. .  ? ?Additional history obtained:  ?Chart/nursing note review.  ? ?EKG: Sinus rhythm.  ? ?Lab Tests:  ?I reviewed & interpreted labs including:  ?CBC, BMP, troponins: unremarkable.  ?TSH: Pending.  ? ?Imaging Studies ordered:  ?I viewed the following imaging, agree with radiologist impression:  ?CXR:  No active cardiopulmonary disease ? ?ED Course:  ?No critical anemia or electrolyte derangement. EKG without ischemia and troponins are flat- doubt ACS. Low risk wells- doubt PE. Cardiac monitoring was performed in the ED- remained in sinus rhythm throughout ED visit without significant tachycardia or arrhythmia. Reassuring work up,  overall seems reasonable for discharge with outpatient PCP/cardiology follow up. I discussed results, treatment plan, need for follow-up, and return precautions with the patient. Provided opportunity for questions, patient confirmed understanding and is in agreement with plan.  ? ?Portions of this note were generated with Lobbyist. Dictation errors may occur despite best attempts at proofreading. ? ?Final Clinical Impression(s) / ED Diagnoses ?Final diagnoses:  ?Palpitations  ? ? ?Rx / DC Orders ?ED Discharge Orders   ? ? None  ? ?  ? ? ?  ?Amaryllis Dyke, PA-C ?01/09/22 0310 ? ?  ?Maudie Flakes, MD ?01/09/22 [phone] ? ?

## 2022-01-09 NOTE — Discharge Instructions (Signed)
You were seen in the emergency department today for chest pain. Your work-up in the emergency department has been overall reassuring. Your labs have been fairly normal and or similar to previous blood work you have had done. Your EKG and the enzyme we use to check your heart did not show an acute heart attack at this time. Your chest x-ray was normal. We are checking your thyroid and will call you if this is abnormal.  ? ?We would like you to follow up closely with your primary care provider and/or the cardiologist provided in your discharge instructions as soon as possible. Return to the ER immediately should you experience any new or worsening symptoms including but not limited to return of pain, worsened pain, vomiting, shortness of breath, dizziness, lightheadedness, passing out, persistent palpitations, or any other concerns that you may have.  ? ?

## 2022-01-20 NOTE — Progress Notes (Signed)
?Cardiology Office Note:   ? ?Date:  01/31/2022  ? ?ID:  Earley Brooke, DOB 07-Jun-1991, MRN [id number] ? ?PCP:  Jac Canavan, PA-C  ?Cardiologist:  Olga Millers, MD  ?Electrophysiologist:  None  ? ?Referring MD: Jac Canavan, PA-C  ? ?Chief Complaint: follow-up of palpitations and atypical chest pain ? ?History of Present Illness:   ? ?Aaryn Sermon is a 31 y.o. male with a history of atypical chest pain, palpitations with PACs/PVCs noted on monitor in 2021, hypertension, prediabetes, GERD, and prior tobacco use (quit about 3 years ago) who is followed by Dr. Jens Som and presents today for routine follow-up. ? ?Patient was referred to Dr. Jens Som in 11/2019 for further evaluation of palpitations after recent ED visit for back pain and palpitations where initial heart rates were reportedly in the 160s. Work-up was unremarkable. At initial visit with Dr. Jens Som, patient reported intermittent palpitations that he described as heart racing that would last for 10 minutes at a time and was more common after eating. He also reported continuous chest pain for 1 year. Pain was felt to be atypical. Echo and Event Monitor were ordered for further evaluation. Echo showed LVEF of 60-65% with normal wall motion and no significant valvular disease. Monitor showed underlying sinus rhythm with PACs and PVCs. ? ?Patient was seen in the ED in 06/2020 for flank pain. Abdominal CT showed no acute findings. EKG showed sinus rhythm with diffuse ST elevation suggestive of pericarditis (but some of changes were similar to prior tracings). He was started on NSAIDS but continued to report chest pain that radiates from his kidneys and legs at follow-up visit with Dr. Jens Som. Dr. Jens Som was not convinced that this was pericarditis. Repeat Echo was ordered to rule out pericardial effusion. Echo showed LVEF of 60-65% with normal wall motion, mild LVH, normal diastolic parameters, and no evidence of pericardial effusion. No  further cardiac work-up was felt to be necessary. ? ?Patient was last seen by Edd Fabian, NP, in 04/2021 at which time he continued to report occasional palpitations and chest discomfort but no exertional pain. Again not felt to be cardiac in nature and no further work-up recommended. ? ?Since last visit, he was recently seen in the ED on 01/09/2022 for intermittent palpitations for the past month that he described as an irregular and fast heart rhythm that lasts for 20 seconds at a time and occurs multiple times a day. No specific triggers. He reported some weakness and occasional chest tightness as well. Work-up was unremarkable. EKG showed showed normal sinus rhythm. High-sensitivity troponin negative x2. CBC, BMET, and TSH normal. He was felt to be stable for discharge. ? ?Patient presents today for further evaluation.  Here alone.  He continues to have daily palpitations that he describes as a fluttering sensation but states they have improved since his ED visit and are back to his baseline.  He has mild weakness with these palpitations.  No known triggers.  He drinks 1 cup of tea every day but otherwise stays away from sugar and caffeine. Otherwise, no cardiac symptoms.  No chest pain, shortness of breath, orthopnea, PND, lower extremity edema, lightheadedness, dizziness, syncope.  His weight is up about 14 lbs since last visit and he states this is because he is eating more and not exercising. ? ?Past Surgical History:  ?Procedure Laterality Date  ? No prior surgery    ? ? ?Current Medications: ?No outpatient medications have been marked as taking for the 01/31/22 encounter (Office Visit)  with Corrin ParkerGoodrich, Sawyer Kahan E, PA-C.  ?  ? ?Allergies:   Patient has no known allergies.  ? ?Social History  ? ?Socioeconomic History  ? Marital status: Single  ?  Spouse name: Not on file  ? Number of children: 0  ? Years of education: Not on file  ? Highest education level: Not on file  ?Occupational History  ? Not on file   ?Tobacco Use  ? Smoking status: Former  ?  Years: 2.00  ?  Types: Cigarettes  ?  Quit date: 02/09/2016  ?  Years since quitting: 5.9  ? Smokeless tobacco: Never  ? Tobacco comments:  ?  [place] daily 1 x  ?Vaping Use  ? Vaping Use: Never used  ?Substance and Sexual Activity  ? Alcohol use: Never  ? Drug use: Never  ? Sexual activity: Not on file  ?Other Topics Concern  ? Not on file  ?Social History Narrative  ? Not on file  ? ?Social Determinants of Health  ? ?Financial Resource Strain: Not on file  ?Food Insecurity: Not on file  ?Transportation Needs: Not on file  ?Physical Activity: Not on file  ?Stress: Not on file  ?Social Connections: Not on file  ?  ? ?Family History: ?The patient's family history includes Esophageal cancer in his father; Heart disease (age of onset: 3029) in his maternal grandmother; Stomach cancer in his paternal aunt and paternal grandfather. ? ?ROS:   ?Please see the history of present illness.    ? ?EKGs/Labs/Other Studies Reviewed:   ? ?The following studies were reviewed today: ? ?Event Monitor 11/27/2019 to 12/26/2019: ?Sinus bradycardia, normal sinus rhythm, sinus tachycardia, PACs and PVCs. ?_______________ ? ?Echocardiogram 07/07/2020: ?Impressions: ? 1. Left ventricular ejection fraction, by estimation, is 60 to 65%. The  ?left ventricle has normal function. The left ventricle has no regional  ?wall motion abnormalities. There is mild concentric left ventricular  ?hypertrophy. Left ventricular diastolic  ?parameters were normal. The average left ventricular global longitudinal  ?strain is -16.3 %.  ? 2. Right ventricular systolic function is normal. The right ventricular  ?size is normal. There is normal pulmonary artery systolic pressure. The  ?estimated right ventricular systolic pressure is 23.8 mmHg.  ? 3. The mitral valve is normal in structure. Trivial mitral valve  ?regurgitation. No evidence of mitral stenosis.  ? 4. The aortic valve is normal in structure. Aortic valve  regurgitation is  ?not visualized. No aortic stenosis is present.  ? 5. The inferior vena cava is normal in size with greater than 50%  ?respiratory variability, suggesting right atrial pressure of 3 mmHg. ? ?EKG:  EKG not ordered today.  ? ?Recent Labs: ?05/15/2021: ALT 18 ?01/08/2022: BUN 10; Creatinine, Ser 0.80; Hemoglobin 15.1; Platelets 342; Potassium 4.2; Sodium 137 ?01/09/2022: TSH 3.091  ?Recent Lipid Panel ?   ?Component Value Date/Time  ? CHOL 154 04/21/2021 1145  ? TRIG 48 04/21/2021 1145  ? HDL 43 04/21/2021 1145  ? CHOLHDL 3.6 04/21/2021 1145  ? LDLCALC 101 (H) 04/21/2021 1145  ? ? ?Physical Exam:   ? ?Vital Signs: BP 120/70 (BP Location: Left Arm, Patient Position: Sitting, Cuff Size: Large)   Pulse 68   Ht 5\' 10"  (1.778 m)   Wt 269 lb (122 kg)   BMI 38.60 kg/m?    ? ?Wt Readings from Last 3 Encounters:  ?01/31/22 269 lb (122 kg)  ?08/30/21 255 lb 3.2 oz (115.8 kg)  ?08/14/21 252 lb 6.4 oz (114.5 kg)  ?  ? ?  General: 31 y.o. male in no acute distress. ?HEENT: Normocephalic and atraumatic. Sclera clear. ?Neck: Supple. No carotid bruits. No JVD. ?Heart:  RRR. Distinct S1 and S2. No murmurs, gallops, or rubs. Radial pulses 2+ and equal bilaterally. ?Lungs: No increased work of breathing. Clear to ausculation bilaterally. No wheezes, rhonchi, or rales.  ?Abdomen: Soft, non-distended, and non-tender to palpation.  ?Extremities: No lower extremity edema.    ?Skin: Warm and dry. ?Neuro: Alert and oriented x3. No focal deficits. ?Psych: Normal affect. Responds appropriately. ? ? ?Assessment:   ? ?1. Palpitations   ?2. Atypical chest pain   ?3. Primary hypertension   ?4. Prediabetes   ?5. Obesity (BMI 30-39.9)   ? ? ?Plan:   ? ?Palpitations ?Long history of palpitations. Monitor in 2021 showed PACs/PVC. She was recently seen in the ED earlier this month for palpitations and work-up unremarkable. ?- He notes improvement in palpitations since ED visit. He does continue to have daily palpitations but they are back  to his baseline. No clear triggers. ?- Previously prescribed Propranolol but he is no longer on this. I recommended holding off on beta-blocker unless palpitations worsen. ? ?History Atypical Chest Pain ?Patient has a histor

## 2022-01-31 ENCOUNTER — Encounter: Payer: Self-pay | Admitting: Student

## 2022-01-31 ENCOUNTER — Ambulatory Visit (INDEPENDENT_AMBULATORY_CARE_PROVIDER_SITE_OTHER): Payer: 59 | Admitting: Student

## 2022-01-31 VITALS — BP 120/70 | HR 68 | Ht 70.0 in | Wt 269.0 lb

## 2022-01-31 DIAGNOSIS — I10 Essential (primary) hypertension: Secondary | ICD-10-CM

## 2022-01-31 DIAGNOSIS — R7303 Prediabetes: Secondary | ICD-10-CM | POA: Diagnosis not present

## 2022-01-31 DIAGNOSIS — E669 Obesity, unspecified: Secondary | ICD-10-CM

## 2022-01-31 DIAGNOSIS — R002 Palpitations: Secondary | ICD-10-CM | POA: Diagnosis not present

## 2022-01-31 DIAGNOSIS — R0789 Other chest pain: Secondary | ICD-10-CM | POA: Diagnosis not present

## 2022-01-31 NOTE — Patient Instructions (Signed)
Medication Instructions:  ?No changes ? ? ?*If you need a refill on your cardiac medications before your next appointment, please call your pharmacy* ? ? ?Lab Work: ?Please come to the office in 3 months (June 2023)  for fasting labs ?LIPIDS ?LIVER PANEL ?HgbA1c ?If you have labs (blood work) drawn today and your tests are completely normal, you will receive your results only by: ?MyChart Message (if you have MyChart) OR ?A paper copy in the mail ?If you have any lab test that is abnormal or we need to change your treatment, we will call you to review the results. ? ? ?Testing/Procedures: ?Not needed ? ? ?Follow-Up: ?At [facility], you and your health needs are our priority.  As part of our continuing mission to provide you with exceptional heart care, we have created designated Provider Care Teams.  These Care Teams include your primary Cardiologist (physician) and Advanced Practice Providers (APPs -  Physician Assistants and Nurse Practitioners) who all work together to provide you with the care you need, when you need it. ? ?  ? ?Your next appointment:   ?12 month(s) ? ?The format for your next appointment:   ?In Person ? ?Provider:   ?Kirk Ruths, MD  ? ? ? ?

## 2022-05-03 LAB — LIPID PANEL
Chol/HDL Ratio: 3 ratio (ref 0.0–5.0)
Cholesterol, Total: 152 mg/dL (ref 100–199)
HDL: 50 mg/dL (ref >39)
LDL Chol Calc (NIH): 92 mg/dL (ref 0–99)
Triglycerides: 47 mg/dL (ref 0–149)
VLDL Cholesterol Cal: 10 mg/dL (ref 5–40)

## 2022-05-03 LAB — HEMOGLOBIN A1C
Est. average glucose Bld gHb Est-mCnc: 120 mg/dL
Hgb A1c MFr Bld: 5.8 % — ABNORMAL HIGH (ref 4.8–5.6)

## 2022-05-03 LAB — HEPATIC FUNCTION PANEL
ALT: 16 IU/L (ref 0–44)
AST: 17 IU/L (ref 0–40)
Albumin: 4.5 g/dL (ref 4.0–5.0)
Alkaline Phosphatase: 73 IU/L (ref 44–121)
Bilirubin Total: 0.4 mg/dL (ref 0.0–1.2)
Bilirubin, Direct: 0.16 mg/dL (ref 0.00–0.40)
Total Protein: 7.6 g/dL (ref 6.0–8.5)

## 2022-07-11 ENCOUNTER — Encounter: Payer: Self-pay | Admitting: Internal Medicine

## 2022-08-14 ENCOUNTER — Encounter: Payer: Self-pay | Admitting: Internal Medicine

## 2022-11-13 ENCOUNTER — Emergency Department (HOSPITAL_COMMUNITY)
Admission: EM | Admit: 2022-11-13 | Discharge: 2022-11-13 | Disposition: A | Discharge status: Home / Self Care | Payer: BLUE CROSS/BLUE SHIELD | Attending: Emergency Medicine | Admitting: Emergency Medicine

## 2022-11-13 ENCOUNTER — Encounter (HOSPITAL_COMMUNITY): Payer: Self-pay

## 2022-11-13 ENCOUNTER — Other Ambulatory Visit: Payer: Self-pay

## 2022-11-13 DIAGNOSIS — I10 Essential (primary) hypertension: Secondary | ICD-10-CM | POA: Diagnosis not present

## 2022-11-13 DIAGNOSIS — K219 Gastro-esophageal reflux disease without esophagitis: Secondary | ICD-10-CM | POA: Diagnosis not present

## 2022-11-13 DIAGNOSIS — R1013 Epigastric pain: Secondary | ICD-10-CM

## 2022-11-13 DIAGNOSIS — R1012 Left upper quadrant pain: Secondary | ICD-10-CM | POA: Diagnosis present

## 2022-11-13 LAB — URINALYSIS, ROUTINE W REFLEX MICROSCOPIC
Bilirubin Urine: NEGATIVE
Glucose, UA: NEGATIVE mg/dL
Hgb urine dipstick: NEGATIVE
Ketones, ur: NEGATIVE mg/dL
Leukocytes,Ua: NEGATIVE
Nitrite: NEGATIVE
Protein, ur: NEGATIVE mg/dL
Specific Gravity, Urine: 1.017 (ref 1.005–1.030)
pH: 5 (ref 5.0–8.0)

## 2022-11-13 LAB — COMPREHENSIVE METABOLIC PANEL
ALT: 19 U/L (ref 0–44)
AST: 23 U/L (ref 15–41)
Albumin: 4.1 g/dL (ref 3.5–5.0)
Alkaline Phosphatase: 61 U/L (ref 38–126)
Anion gap: 9 (ref 5–15)
BUN: 10 mg/dL (ref 6–20)
CO2: 26 mmol/L (ref 22–32)
Calcium: 9.3 mg/dL (ref 8.9–10.3)
Chloride: 100 mmol/L (ref 98–111)
Creatinine, Ser: 0.75 mg/dL (ref 0.61–1.24)
GFR, Estimated: >60 mL/min (ref >60)
Glucose, Bld: 105 mg/dL — ABNORMAL HIGH (ref 70–99)
Potassium: 3.8 mmol/L (ref 3.5–5.1)
Sodium: 135 mmol/L (ref 135–145)
Total Bilirubin: 0.3 mg/dL (ref 0.3–1.2)
Total Protein: 7.9 g/dL (ref 6.5–8.1)

## 2022-11-13 LAB — CBC
HCT: 47.5 % (ref 39.0–52.0)
Hemoglobin: 15.1 g/dL (ref 13.0–17.0)
MCH: 27.6 pg (ref 26.0–34.0)
MCHC: 31.8 g/dL (ref 30.0–36.0)
MCV: 86.7 fL (ref 80.0–100.0)
Platelets: 366 10*3/uL (ref 150–400)
RBC: 5.48 MIL/uL (ref 4.22–5.81)
RDW: 12.7 % (ref 11.5–15.5)
WBC: 7.1 10*3/uL (ref 4.0–10.5)
nRBC: 0 % (ref 0.0–0.2)

## 2022-11-13 LAB — LIPASE, BLOOD: Lipase: 46 U/L (ref 11–51)

## 2022-11-13 MED ORDER — ONDANSETRON HCL 4 MG PO TABS: 4.0000 mg | ORAL_TABLET | Freq: Four times a day (QID) | ORAL | 0 refills | Status: AC

## 2022-11-13 MED ORDER — PANTOPRAZOLE SODIUM 40 MG PO TBEC: 40.0000 mg | DELAYED_RELEASE_TABLET | Freq: Every day | ORAL | 0 refills | Status: AC

## 2022-11-13 MED ORDER — ONDANSETRON 4 MG PO TBDP
4.0000 mg | ORAL_TABLET | Freq: Once | ORAL | Status: AC
  Administered 2022-11-13: 4 mg via ORAL
  Filled 2022-11-13: qty 1

## 2022-11-13 MED ORDER — ALUM & MAG HYDROXIDE-SIMETH 200-200-20 MG/5ML PO SUSP
30.0000 mL | Freq: Once | ORAL | Status: AC
Start: 2022-11-13 — End: 2022-11-13
  Administered 2022-11-13: 30 mL via ORAL
  Filled 2022-11-13: qty 30

## 2022-11-13 MED ORDER — PANTOPRAZOLE SODIUM 40 MG PO TBEC
40.0000 mg | DELAYED_RELEASE_TABLET | Freq: Once | ORAL | Status: AC
  Administered 2022-11-13: 40 mg via ORAL
  Filled 2022-11-13: qty 1

## 2022-11-13 NOTE — ED Triage Notes (Signed)
Pt c/o LUQ painx10d. Pt c/o int vomiting after eatingx10d. Pt states vomited 3 times in last 24 hrs

## 2022-11-13 NOTE — ED Provider Notes (Signed)
[facility] EMERGENCY DEPARTMENT Provider Note   CSN: [id number] Arrival date & time: 11/13/22  1453     History  Chief Complaint  Patient presents with   Abdominal Pain    Timothy Barry is a 32 y.o. male.  Patient is a 32 year old male with a past medical history of hypertension and GERD presenting to the emergency department with abdominal pain.  Patient states that he has had left upper quadrant abdominal pain for approximately the last 2 weeks.  He states that the pain comes and goes and gets worse anytime he is eating.  He states that he has nausea and occasional vomiting.  He denies any fevers or chills.  He denies any diarrhea does report some constipation.  He denies any black or bloody stools.  Dates he has not been taking anything for his symptoms at home.  The history is provided by the patient.  Abdominal Pain      Home Medications Prior to Admission medications   Medication Sig Start Date End Date Taking? Authorizing Provider  ondansetron (ZOFRAN) 4 MG tablet Take 1 tablet (4 mg total) by mouth every 6 (six) hours. 11/13/22  Yes Theresia Lo, [place] K, DO  pantoprazole (PROTONIX) 40 MG tablet Take 1 tablet (40 mg total) by mouth daily. 11/13/22  Yes Elayne Snare K, DO      Allergies    Patient has no known allergies.    Review of Systems   Review of Systems  Gastrointestinal:  Positive for abdominal pain.    Physical Exam Updated Vital Signs BP 136/86   Pulse 75   Temp 98.8 F (37.1 C)   Resp 16   Ht 5\' 10"  (1.778 m)   Wt 122 kg   SpO2 100%   BMI 38.59 kg/m  Physical Exam Vitals and nursing note reviewed.  Constitutional:      General: He is not in acute distress.    Appearance: He is well-developed. He is obese.  HENT:     Head: Normocephalic and atraumatic.     Mouth/Throat:     Mouth: Mucous membranes are moist.     Pharynx: Oropharynx is clear.  Eyes:     Pupils: Pupils are equal, round, and reactive to light.   Cardiovascular:     Rate and Rhythm: Normal rate and regular rhythm.     Heart sounds: Normal heart sounds.  Pulmonary:     Effort: Pulmonary effort is normal.     Breath sounds: Normal breath sounds.  Abdominal:     General: Abdomen is flat.     Palpations: Abdomen is soft.     Tenderness: There is no abdominal tenderness. There is no guarding or rebound.  Skin:    General: Skin is warm and dry.  Neurological:     General: No focal deficit present.     Mental Status: He is alert and oriented to person, place, and time.  Psychiatric:        Mood and Affect: Mood normal.        Behavior: Behavior normal.     ED Results / Procedures / Treatments   Labs (all labs ordered are listed, but only abnormal results are displayed) Labs Reviewed  COMPREHENSIVE METABOLIC PANEL - Abnormal; Notable for the following components:      Result Value   Glucose, Bld 105 (*)    All other components within normal limits  CBC  URINALYSIS, ROUTINE W REFLEX MICROSCOPIC  LIPASE, BLOOD    EKG  None  Radiology No results found.  Procedures Procedures    Medications Ordered in ED Medications  pantoprazole (PROTONIX) EC tablet 40 mg (has no administration in time range)  alum & mag hydroxide-simeth (MAALOX/MYLANTA) 200-200-20 MG/5ML suspension 30 mL (has no administration in time range)  ondansetron (ZOFRAN-ODT) disintegrating tablet 4 mg (has no administration in time range)    ED Course/ Medical Decision Making/ A&P                           Medical Decision Making This patient presents to the ED with chief complaint(s) of abdominal pain with pertinent past medical history of GERD, HTN which further complicates the presenting complaint. The complaint involves an extensive differential diagnosis and also carries with it a high risk of complications and morbidity.    The differential diagnosis includes gastritis, GERD, PUD, anemia, hepatitis, pancreatitis, cholelithiasis, cholecystitis,  dehydration, electrolyte abnormality  Additional history obtained: Additional history obtained from N/A Records reviewed outpatient GI records  ED Course and Reassessment: Patient was initially evaluated by provider in triage and had labs performed and urine that were within normal range.  The patient has no point abdominal tenderness to palpation making intra-abdominal infection unlikely.  I reviewed patient's previous GI records and he does have a history of GERD with concern for possible abdominal migraines which is likely causing his symptoms.  He will be restarted on his PPI in addition to given Maalox and Zofran as needed.  He was recommended to follow-up with his GI doctor and was given strict return precautions.  Independent labs interpretation:  The following labs were independently interpreted: Within normal range  Independent visualization of imaging: N/A  Consultation: - Consulted or discussed management/test interpretation w/ external professional: N/A  Consideration for admission or further workup: Patient has no emergent conditions requiring admission or further work-up at this time and is stable for discharge home with primary care and GI follow-up  Social Determinants of health: N/A    Amount and/or Complexity of Data Reviewed Labs: ordered.  Risk OTC drugs. Prescription drug management.          Final Clinical Impression(s) / ED Diagnoses Final diagnoses:  Epigastric pain  Gastroesophageal reflux disease, unspecified whether esophagitis present    Rx / DC Orders ED Discharge Orders          Ordered    pantoprazole (PROTONIX) 40 MG tablet  Daily        11/13/22 2143    ondansetron (ZOFRAN) 4 MG tablet  Every 6 hours        11/13/22 2143              Kemper Durie, DO 11/13/22 2144

## 2022-11-13 NOTE — Discharge Instructions (Signed)
You were seen in the emergency department for your abdominal pain.  This is likely due to acid reflux or your GI doctor was concerned that you might have abdominal migraines.  You should restart your antacid medication and take this daily for at least the next 2 weeks to see if it is helping with your symptoms.  You can take Zofran as needed for nausea and you can get Maalox over-the-counter as needed for additional pain.  You should follow-up with your GI doctor in the next few days to have your symptoms rechecked.  He should return to the emergency department if you are having fevers, significantly worsening pain, repetitive vomiting despite the nausea medication or any other new or concerning symptoms.

## 2022-11-14 ENCOUNTER — Telehealth: Payer: Self-pay | Admitting: Medical

## 2022-11-14 ENCOUNTER — Telehealth: Payer: Self-pay

## 2022-11-14 ENCOUNTER — Encounter: Payer: Self-pay | Admitting: Medical

## 2022-11-14 NOTE — Telephone Encounter (Signed)
Thornton Park, MD  Carl Best, RN  Please schedule office follow-up with first available APP. Thanks.  KLB

## 2022-11-14 NOTE — Telephone Encounter (Signed)
Left message for patient to call back  

## 2022-11-14 NOTE — Telephone Encounter (Signed)
Patient has been scheduled to see 1/15 at 2:30 with [facility]

## 2022-11-14 NOTE — Telephone Encounter (Signed)
Transition Care Management Unsuccessful Follow-up Telephone Call  Date of discharge and from where:  11/13/2022 [facility]  Attempts:  1st Attempt  Reason for unsuccessful TCM follow-up call:  Unable to reach patient   Letter mailed

## 2022-11-19 ENCOUNTER — Ambulatory Visit: Payer: BLUE CROSS/BLUE SHIELD | Admitting: Physician Assistant

## 2022-11-21 ENCOUNTER — Other Ambulatory Visit: Payer: Self-pay

## 2022-11-21 ENCOUNTER — Emergency Department (HOSPITAL_COMMUNITY)
Admission: EM | Admit: 2022-11-21 | Discharge: 2022-11-21 | Disposition: A | Discharge status: Home / Self Care | Payer: BLUE CROSS/BLUE SHIELD | Attending: Emergency Medicine | Admitting: Emergency Medicine

## 2022-11-21 ENCOUNTER — Encounter (HOSPITAL_COMMUNITY): Payer: Self-pay

## 2022-11-21 DIAGNOSIS — R739 Hyperglycemia, unspecified: Secondary | ICD-10-CM | POA: Insufficient documentation

## 2022-11-21 DIAGNOSIS — R1012 Left upper quadrant pain: Secondary | ICD-10-CM

## 2022-11-21 DIAGNOSIS — R110 Nausea: Secondary | ICD-10-CM | POA: Diagnosis not present

## 2022-11-21 DIAGNOSIS — I10 Essential (primary) hypertension: Secondary | ICD-10-CM | POA: Diagnosis not present

## 2022-11-21 LAB — URINALYSIS, ROUTINE W REFLEX MICROSCOPIC
Bacteria, UA: NONE SEEN
Bilirubin Urine: NEGATIVE
Glucose, UA: NEGATIVE mg/dL
Ketones, ur: NEGATIVE mg/dL
Leukocytes,Ua: NEGATIVE
Nitrite: NEGATIVE
Protein, ur: NEGATIVE mg/dL
Specific Gravity, Urine: 1.017 (ref 1.005–1.030)
pH: 5 (ref 5.0–8.0)

## 2022-11-21 LAB — COMPREHENSIVE METABOLIC PANEL
ALT: 15 U/L (ref 0–44)
AST: 20 U/L (ref 15–41)
Albumin: 4 g/dL (ref 3.5–5.0)
Alkaline Phosphatase: 56 U/L (ref 38–126)
Anion gap: 7 (ref 5–15)
BUN: 14 mg/dL (ref 6–20)
CO2: 29 mmol/L (ref 22–32)
Calcium: 8.9 mg/dL (ref 8.9–10.3)
Chloride: 102 mmol/L (ref 98–111)
Creatinine, Ser: 0.75 mg/dL (ref 0.61–1.24)
GFR, Estimated: >60 mL/min (ref >60)
Glucose, Bld: 128 mg/dL — ABNORMAL HIGH (ref 70–99)
Potassium: 4.1 mmol/L (ref 3.5–5.1)
Sodium: 138 mmol/L (ref 135–145)
Total Bilirubin: 0.5 mg/dL (ref 0.3–1.2)
Total Protein: 7.7 g/dL (ref 6.5–8.1)

## 2022-11-21 LAB — CBC WITH DIFFERENTIAL/PLATELET
Abs Immature Granulocytes: 0.01 10*3/uL (ref 0.00–0.07)
Basophils Absolute: 0 10*3/uL (ref 0.0–0.1)
Basophils Relative: 0 %
Eosinophils Absolute: 0.9 10*3/uL — ABNORMAL HIGH (ref 0.0–0.5)
Eosinophils Relative: 10 %
HCT: 45.5 % (ref 39.0–52.0)
Hemoglobin: 14.6 g/dL (ref 13.0–17.0)
Immature Granulocytes: 0 %
Lymphocytes Relative: 44 %
Lymphs Abs: 3.9 10*3/uL (ref 0.7–4.0)
MCH: 27.4 pg (ref 26.0–34.0)
MCHC: 32.1 g/dL (ref 30.0–36.0)
MCV: 85.4 fL (ref 80.0–100.0)
Monocytes Absolute: 0.8 10*3/uL (ref 0.1–1.0)
Monocytes Relative: 9 %
Neutro Abs: 3.3 10*3/uL (ref 1.7–7.7)
Neutrophils Relative %: 37 %
Platelets: 324 10*3/uL (ref 150–400)
RBC: 5.33 MIL/uL (ref 4.22–5.81)
RDW: 12.8 % (ref 11.5–15.5)
WBC: 8.9 10*3/uL (ref 4.0–10.5)
nRBC: 0 % (ref 0.0–0.2)

## 2022-11-21 LAB — LIPASE, BLOOD: Lipase: 48 U/L (ref 11–51)

## 2022-11-21 NOTE — ED Triage Notes (Signed)
LLQ abdominal pain x 2 weeks.   Sts no BM in 2 days but feels consistent to his hx of constipation.   Denies n/v/d.

## 2022-11-21 NOTE — ED Provider Triage Note (Signed)
Emergency Medicine Provider Triage Evaluation Note  Timothy Barry , a 32 y.o. male  was evaluated in triage.  Pt complains of left upper quadrant abdominal pain.  Symptoms have been present for approximately 1 month.  Was seen at [facility] for similar symptoms 8 days ago and was supposed to see a specialist, however had his insurance changed and has not been able to see 1.  States his pain is bad enough that he was unable to sleep last night.  Reports nausea but denies vomiting.  States he has had episodes of constipation since the abdominal pain began.  He has a bowel movement once every other day.  Review of Systems  Positive: As above Negative: As above  Physical Exam  BP (!) 143/90   Pulse 90   Temp 98.3 F (36.8 C) (Oral)   Resp 18   Ht 5\' 10"  (1.778 m)   Wt 120.2 kg   SpO2 100%   BMI 38.02 kg/m  Gen:   Awake, no distress   Resp:  Normal effort  MSK:   Moves extremities without difficulty  Other:  No abdominal tenderness to palpation  Medical Decision Making  Medically screening exam initiated at 6:55 AM.  Appropriate orders placed.  Ka Flammer was informed that the remainder of the evaluation will be completed by another provider, this initial triage assessment does not replace that evaluation, and the importance of remaining in the ED until their evaluation is complete.     Roylene Reason, [place] 11/21/22 [phone]

## 2022-11-21 NOTE — Discharge Instructions (Signed)
??? ??? ??????? ????? ???? ????? ?? ???? ?? ?????. ??? ???? ?? ??????? ??????? ??? ???? ?? ???. ???? ????? ?????? ?????? ?? MiraLAX. ??? ???? ???? ???? ????. ???? ??????? ??????? ???????? ??? ??????. ?????? ???????. ??? ??????? ????? ??? ???? ???? ??? ????? ?? ????? ????? ???????. ??????? ??????? ???????? ?? ??? ???: ????? ???? ?????? ?????? ?? ?????? ??: ?. ?????? ?? ???????. ??? ???? ?????? ?????? ?? [facility] ????? ????? ?????? ??????? ??????? ????? ?????? ?????? ?? [facility]. ?? ????? ??? ???? ???????? ?? ????? ?????? ?????? ?? ???? ??? ??????? ?????? ??????? ??? ???? ???? ?? ?? ??????? ???????:  ?? ????? ????? ????? ?? ????? ???? ??????? ?????? ??? ??????.  ?? ????? ?????? ?? ?????.  ???? ????? ?? ????? ????? ???? ??? ?????? ?????? ?? ????? ?????? ?????? ?? ?????. ???? ?? ???? ??? ????? ??? ?????? ?????? ?? ?????? ??????? ???????.  ??? ???? ???? ???? ?? ????? ?? ???? ???? ???????.  ??? ????? ?? ??? ????? ?? ????? ?? ?????? ?? ?????.  ???? ?????? ??????? ???: o ??? ???? ?????? ?? ???? ????? ???? ?? ?????? ?? ??? ???? ???. o ?????? ????????. o ???? ????. o ?????? ???????. ? ??????. ? ?????.  ??? ????? ?? ????? ?? ?????? ?? ??? ?? ?????. ?? ??? ????? ?? ???? ?? ???? ???? ???? ?????? ???? ???? ?????? ?????? ???? ??????. ???? ????? ?????? ????????? ???????.  ?? ?????? ?????? ??? ??? ???? ??? ???? ???? ?????? ?? ???? ?? ???? ?? ??????? ?? ????? ?? ??????. ???? ?????? 911 ????? ??????? ?????? ??????? ??????? ?? ???? ???? ???.  ????? ?????? ????????? ???????? ?????? ??????? ??????? ??? ???? MyChart ????? ??. ???? ?????? ???? ??????? ???? ???? ?? ???? ??????? ??????? ????? ?? ?????????? ??????? ?? ????? ???????? ?????? ??.  ??????: ???? ?? ??? ????? ?? ??? ???? ???????? ?????? ?????? ??? ?????. ?? ??? ?? ??? ????? ?????? ??? ???? ?? ??? ????? ????? ???????? ??? ???????? ??????.  You have been seen today for your complaint of abdominal pain. Your lab work was reassuring and showed no  abnormalities. Your discharge medications include MiraLAX.  This is an over-the-counter laxative.  Follow dosing instructions on the package. Fiber supplements.  These are over-the-counter and may help regulate your bowel movements. Home care instructions are as follows:  Increase your water intake Follow up with: Dr. Paulita Fujita.  He is a GI doctor with [facility].  You may also try to contact [facility].  Both of these phone numbers are listed in your discharge paperwork Please seek immediate medical care if you develop any of the following symptoms: Your pain does not go away as soon as your health care provider told you to expect. You cannot stop vomiting. Your pain is only in areas of the abdomen, such as the right side or the left lower portion of the abdomen. Pain on the right side could be caused by appendicitis. You have bloody or black stools, or stools that look like tar. You have severe pain, cramping, or bloating in your abdomen. You have signs of dehydration, such as: Dark urine, very little urine, or no urine. Cracked lips. Dry mouth. Sunken eyes. Sleepiness. Weakness. You have trouble breathing or chest pain. At this time there does not appear to be the presence of an emergent medical condition, however there is always the potential for conditions to change. Please read and follow the below instructions.  Do not take your medicine if  develop an itchy rash, swelling in your mouth  or lips, or difficulty breathing; call 911 and seek immediate emergency medical attention if this occurs.  You may review your lab tests and imaging results in their entirety on your MyChart account.  Please discuss all results of fully with your primary care provider and other specialist at your follow-up visit.  Note: Portions of this text may have been transcribed using voice recognition software. Every effort was made to ensure accuracy; however, inadvertent computerized transcription  errors may still be present.

## 2022-11-21 NOTE — ED Provider Notes (Signed)
[facility]-EMERGENCY DEPT Provider Note   CSN: [id number] Arrival date & time: 11/21/22  4098     History  Chief Complaint  Patient presents with   Abdominal Pain    Timothy Barry is a 32 y.o. male.  With a history of hypertension, GERD, abdominal pain who presents to the ED for evaluation of left upper quadrant pain.  Symptoms have been present for nearly 3 weeks.  He presented to [facility] 9 days ago for same and was treated for GERD.  He was encouraged to follow-up with GI.  He was unable to follow-up due to change in his insurance.  He was unable to sleep last night secondary to the pain.  Describes it as an ache.  It was constant for approximately 2 hours last night, but since then it comes and goes.  It occurs approximately every 4 hours.  He states he has had associated constipation.  He has bowel movement approximately every other day.  Reports nausea but denies vomiting or diarrhea.  Denies history of abdominal surgeries.  Denies history of diverticulitis.  He states he was scheduled for an appointment with GI, but was called and told that his insurance would not work.  He was encouraged to follow-up with his primary care provider for referral to a different GI specialist at that time.  He states that his old primary care provider is also no longer in network and he has called numerous other family medicine providers but they are not in network either.  History is provided by the patient   Abdominal Pain Associated symptoms: nausea        Home Medications Prior to Admission medications   Medication Sig Start Date End Date Taking? Authorizing Provider  ondansetron (ZOFRAN) 4 MG tablet Take 1 tablet (4 mg total) by mouth every 6 (six) hours. 11/13/22   Elayne Snare K, DO  pantoprazole (PROTONIX) 40 MG tablet Take 1 tablet (40 mg total) by mouth daily. 11/13/22   Rexford Maus, DO      Allergies    Patient has no known allergies.    Review of  Systems   Review of Systems  Gastrointestinal:  Positive for abdominal pain and nausea.  All other systems reviewed and are negative.   Physical Exam Updated Vital Signs BP (!) 143/90   Pulse 90   Temp 98.3 F (36.8 C) (Oral)   Resp 18   Ht 5\' 10"  (1.778 m)   Wt 120.2 kg   SpO2 100%   BMI 38.02 kg/m  Physical Exam Vitals and nursing note reviewed.  Constitutional:      General: He is not in acute distress.    Appearance: He is well-developed. He is obese. He is not ill-appearing, toxic-appearing or diaphoretic.  HENT:     Head: Normocephalic and atraumatic.  Eyes:     Conjunctiva/sclera: Conjunctivae normal.  Cardiovascular:     Rate and Rhythm: Normal rate and regular rhythm.     Heart sounds: No murmur heard. Pulmonary:     Effort: Pulmonary effort is normal. No respiratory distress.     Breath sounds: Normal breath sounds. No stridor. No wheezing, rhonchi or rales.  Abdominal:     General: There is no distension.     Palpations: Abdomen is soft.     Tenderness: There is no abdominal tenderness. There is no guarding. Negative signs include Murphy's sign, Rovsing's sign, McBurney's sign, psoas sign and obturator sign.  Musculoskeletal:  General: No swelling.     Cervical back: Neck supple.  Skin:    General: Skin is warm and dry.     Capillary Refill: Capillary refill takes less than 2 seconds.  Neurological:     General: No focal deficit present.     Mental Status: He is alert and oriented to person, place, and time.  Psychiatric:        Mood and Affect: Mood normal.     ED Results / Procedures / Treatments   Labs (all labs ordered are listed, but only abnormal results are displayed) Labs Reviewed  COMPREHENSIVE METABOLIC PANEL - Abnormal; Notable for the following components:      Result Value   Glucose, Bld 128 (*)    All other components within normal limits  URINALYSIS, ROUTINE W REFLEX MICROSCOPIC - Abnormal; Notable for the following  components:   Hgb urine dipstick SMALL (*)    All other components within normal limits  CBC WITH DIFFERENTIAL/PLATELET - Abnormal; Notable for the following components:   Eosinophils Absolute 0.9 (*)    All other components within normal limits  LIPASE, BLOOD    EKG None  Radiology No results found.  Procedures Procedures    Medications Ordered in ED Medications - No data to display  ED Course/ Medical Decision Making/ A&P                             Medical Decision Making This patient presents to the ED for concern of abdominal pain, this involves an extensive number of treatment options, and is a complaint that carries with it a high risk of complications and morbidity.  The differential diagnosis for generalized abdominal pain includes, but is not limited to AAA, gastroenteritis, appendicitis, Bowel obstruction, Bowel perforation. Gastroparesis, DKA, Hernia, Inflammatory bowel disease, mesenteric ischemia, pancreatitis, peritonitis SBP, volvulus.   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 Previous records within EMR system patient message on 09/18/2021 requesting Protonix refill.  Reply stated that refill cannot be provided as he has not seen a primary provider.  Also stated that they would like him to get a repeat colonoscopy Patient message on 11/14/2022 where patient was scheduled with Ellouise Newer on 1/15  I ordered, reviewed and interpreted labs which include: CMP, CBC, lipase, urinalysis.  Hyperglycemia of 128.  Small hemoglobin in urine  Afebrile, hemodynamically stable.  32 year old male presents to the ED for evaluation of left upper quadrant abdominal pain.  This is not a new issue. He has a significant history of abdominal pains and has been seen by GI for numerous years.  Lately he has had issues  with his insurance which is limiting him from following up with primary care or GI.  Today, patient appears to be in no distress.  He has a benign abdominal exam.  His labs revealed hemoglobin in his urine.  He does not have flank pain or urinary symptoms. Labs are otherwise unremarkable.  His vitals are stable.  No signs of acute abdomen at this time.  Imaging not warranted.  He was encouraged to have a repeat colonoscopy recently by his GI provider office.  He reports he has not had a bowel movement in 2 days.  This may be contributing to his symptoms.  He was encouraged to try MiraLAX and increase his  fiber intake.  He was given information for multiple GI providers for follow-up.  He was encouraged to check with his insurance company regarding which providers are in network.  He was given return precautions.  Stable at discharge.  At this time there does not appear to be any evidence of an acute emergency medical condition and the patient appears stable for discharge with appropriate outpatient follow up. Diagnosis was discussed with patient who verbalizes understanding of care plan and is agreeable to discharge. I have discussed return precautions with patient who verbalizes understanding. Patient encouraged to follow-up with their PCP within 1 week. All questions answered.  Note: Portions of this report may have been transcribed using voice recognition software. Every effort was made to ensure accuracy; however, inadvertent computerized transcription errors may still be present.         Final Clinical Impression(s) / ED Diagnoses Final diagnoses:  Left upper quadrant abdominal pain    Rx / DC Orders ED Discharge Orders     None         Roylene Reason, PA-C 11/21/22 [place], [place], DO 11/21/22 [phone]

## 2022-11-22 ENCOUNTER — Encounter: Payer: Self-pay | Admitting: Medical

## 2022-11-22 ENCOUNTER — Telehealth: Payer: Self-pay | Admitting: Medical

## 2022-11-22 NOTE — Telephone Encounter (Signed)
Transition Care Management Unsuccessful Follow-up Telephone Call  Date of discharge and from where:  11/20/2022 [facility]  Attempts:  1st Attempt  Reason for unsuccessful TCM follow-up call:  Left voice message   Letter mailed and PCP removed pt insurance is not out of network. Phone number was provider to pt.
